# Patient Record
Sex: Female | Born: 1950 | Race: White | Hispanic: No
Health system: Western US, Academic
[De-identification: ages and names within clinical notes are randomized; demographics above are authoritative.]

## PROBLEM LIST (undated history)

## (undated) DIAGNOSIS — E119 Type 2 diabetes mellitus without complications: Secondary | ICD-10-CM

## (undated) DIAGNOSIS — F1020 Alcohol dependence, uncomplicated: Secondary | ICD-10-CM

## (undated) DIAGNOSIS — I10 Essential (primary) hypertension: Secondary | ICD-10-CM

## (undated) DIAGNOSIS — S329XXA Fracture of unspecified parts of lumbosacral spine and pelvis, initial encounter for closed fracture: Secondary | ICD-10-CM

## (undated) DIAGNOSIS — M1990 Unspecified osteoarthritis, unspecified site: Secondary | ICD-10-CM

## (undated) HISTORY — PX: APPENDECTOMY: SHX54

---

## 2006-02-18 LAB — CBC AND DIFFERENTIAL
HEMATOCRIT: 32 % — AB (ref 36–46)
HEMOGLOBIN: 11 g/dL — AB (ref 12.0–16.0)
Platelets: 281 10*3/uL (ref 150–399)
WBC: 7.6 10^3/mL

## 2006-02-18 LAB — BASIC METABOLIC PANEL
BUN: 4 mg/dL (ref 4–21)
Creatinine: 0.4 mg/dL — AB (ref 0.5–1.1)
Glucose: 120 mg/dL
Potassium: 4 mmol/L (ref 3.4–5.3)
SODIUM: 127 mmol/L — AB (ref 137–147)

## 2013-11-20 DIAGNOSIS — S329XXA Fracture of unspecified parts of lumbosacral spine and pelvis, initial encounter for closed fracture: Secondary | ICD-10-CM

## 2013-11-20 HISTORY — DX: Fracture of unspecified parts of lumbosacral spine and pelvis, initial encounter for closed fracture: S32.9XXA

## 2016-02-14 ENCOUNTER — Emergency Department (HOSPITAL_COMMUNITY): Payer: MEDICAID

## 2016-02-14 ENCOUNTER — Emergency Department (HOSPITAL_COMMUNITY): Payer: Self-pay

## 2016-02-14 ENCOUNTER — Encounter (HOSPITAL_COMMUNITY): Payer: Self-pay | Admitting: Emergency Medicine

## 2016-02-14 ENCOUNTER — Inpatient Hospital Stay (HOSPITAL_COMMUNITY)
Admission: EM | Admit: 2016-02-14 | Discharge: 2016-02-18 | DRG: 480 | Disposition: A | Discharge status: Skilled Nursing Facility | Payer: Self-pay | Attending: Internal Medicine | Admitting: Internal Medicine

## 2016-02-14 DIAGNOSIS — S72141A Displaced intertrochanteric fracture of right femur, initial encounter for closed fracture: Principal | ICD-10-CM | POA: Diagnosis present

## 2016-02-14 DIAGNOSIS — S72141D Displaced intertrochanteric fracture of right femur, subsequent encounter for closed fracture with routine healing: Secondary | ICD-10-CM

## 2016-02-14 DIAGNOSIS — Z833 Family history of diabetes mellitus: Secondary | ICD-10-CM

## 2016-02-14 DIAGNOSIS — F17210 Nicotine dependence, cigarettes, uncomplicated: Secondary | ICD-10-CM | POA: Diagnosis present

## 2016-02-14 DIAGNOSIS — D62 Acute posthemorrhagic anemia: Secondary | ICD-10-CM

## 2016-02-14 DIAGNOSIS — F1020 Alcohol dependence, uncomplicated: Secondary | ICD-10-CM | POA: Diagnosis present

## 2016-02-14 DIAGNOSIS — M160 Bilateral primary osteoarthritis of hip: Secondary | ICD-10-CM | POA: Diagnosis present

## 2016-02-14 DIAGNOSIS — Y92009 Unspecified place in unspecified non-institutional (private) residence as the place of occurrence of the external cause: Secondary | ICD-10-CM

## 2016-02-14 DIAGNOSIS — I10 Essential (primary) hypertension: Secondary | ICD-10-CM | POA: Diagnosis present

## 2016-02-14 DIAGNOSIS — Z681 Body mass index (BMI) 19 or less, adult: Secondary | ICD-10-CM

## 2016-02-14 DIAGNOSIS — E871 Hypo-osmolality and hyponatremia: Secondary | ICD-10-CM | POA: Diagnosis present

## 2016-02-14 DIAGNOSIS — S72143A Displaced intertrochanteric fracture of unspecified femur, initial encounter for closed fracture: Secondary | ICD-10-CM | POA: Diagnosis present

## 2016-02-14 DIAGNOSIS — Y92099 Unspecified place in other non-institutional residence as the place of occurrence of the external cause: Secondary | ICD-10-CM

## 2016-02-14 DIAGNOSIS — S72009A Fracture of unspecified part of neck of unspecified femur, initial encounter for closed fracture: Secondary | ICD-10-CM | POA: Diagnosis present

## 2016-02-14 DIAGNOSIS — W19XXXA Unspecified fall, initial encounter: Secondary | ICD-10-CM

## 2016-02-14 DIAGNOSIS — E43 Unspecified severe protein-calorie malnutrition: Secondary | ICD-10-CM | POA: Insufficient documentation

## 2016-02-14 DIAGNOSIS — Z7982 Long term (current) use of aspirin: Secondary | ICD-10-CM

## 2016-02-14 DIAGNOSIS — F1029 Alcohol dependence with unspecified alcohol-induced disorder: Secondary | ICD-10-CM

## 2016-02-14 DIAGNOSIS — Z419 Encounter for procedure for purposes other than remedying health state, unspecified: Secondary | ICD-10-CM

## 2016-02-14 DIAGNOSIS — E876 Hypokalemia: Secondary | ICD-10-CM | POA: Diagnosis present

## 2016-02-14 DIAGNOSIS — S72144A Nondisplaced intertrochanteric fracture of right femur, initial encounter for closed fracture: Secondary | ICD-10-CM

## 2016-02-14 DIAGNOSIS — M1990 Unspecified osteoarthritis, unspecified site: Secondary | ICD-10-CM | POA: Insufficient documentation

## 2016-02-14 DIAGNOSIS — E119 Type 2 diabetes mellitus without complications: Secondary | ICD-10-CM

## 2016-02-14 HISTORY — DX: Fracture of unspecified parts of lumbosacral spine and pelvis, initial encounter for closed fracture: S32.9XXA

## 2016-02-14 HISTORY — DX: Unspecified osteoarthritis, unspecified site: M19.90

## 2016-02-14 HISTORY — DX: Type 2 diabetes mellitus without complications: E11.9

## 2016-02-14 HISTORY — DX: Essential (primary) hypertension: I10

## 2016-02-14 HISTORY — DX: Alcohol dependence, uncomplicated: F10.20

## 2016-02-14 LAB — COMPREHENSIVE METABOLIC PANEL
ALBUMIN: 3.7 g/dL (ref 3.5–5.0)
ALK PHOS: 136 U/L — AB (ref 38–126)
ALT: 25 U/L (ref 14–54)
ANION GAP: 17 — AB (ref 5–15)
AST: 48 U/L — AB (ref 15–41)
BILIRUBIN TOTAL: 0.8 mg/dL (ref 0.3–1.2)
BUN: 6 mg/dL (ref 6–20)
CALCIUM: 10.1 mg/dL (ref 8.9–10.3)
CO2: 20 mmol/L — AB (ref 22–32)
Chloride: 90 mmol/L — ABNORMAL LOW (ref 101–111)
Creatinine, Ser: 0.6 mg/dL (ref 0.44–1.00)
GFR calc Af Amer: >60 mL/min (ref >60)
GFR calc non Af Amer: >60 mL/min (ref >60)
GLUCOSE: 71 mg/dL (ref 65–99)
Potassium: 3.9 mmol/L (ref 3.5–5.1)
SODIUM: 127 mmol/L — AB (ref 135–145)
TOTAL PROTEIN: 7.4 g/dL (ref 6.5–8.1)

## 2016-02-14 LAB — CBC WITH DIFFERENTIAL/PLATELET
BASOS ABS: 0 10*3/uL (ref 0.0–0.1)
Basophils Relative: 0 %
Eosinophils Absolute: 0.1 10*3/uL (ref 0.0–0.7)
Eosinophils Relative: 1 %
HEMATOCRIT: 36.5 % (ref 36.0–46.0)
HEMOGLOBIN: 13.5 g/dL (ref 12.0–15.0)
LYMPHS ABS: 0.7 10*3/uL (ref 0.7–4.0)
LYMPHS PCT: 11 %
MCH: 35 pg — AB (ref 26.0–34.0)
MCHC: 37 g/dL — ABNORMAL HIGH (ref 30.0–36.0)
MCV: 94.6 fL (ref 78.0–100.0)
Monocytes Absolute: 0.6 10*3/uL (ref 0.1–1.0)
Monocytes Relative: 9 %
NEUTROS ABS: 5.1 10*3/uL (ref 1.7–7.7)
NEUTROS PCT: 78 %
PLATELETS: 365 10*3/uL (ref 150–400)
RBC: 3.86 MIL/uL — AB (ref 3.87–5.11)
RDW: 13.5 % (ref 11.5–15.5)
WBC: 6.5 10*3/uL (ref 4.0–10.5)

## 2016-02-14 LAB — SURGICAL PCR SCREEN
MRSA, PCR: NEGATIVE
STAPHYLOCOCCUS AUREUS: NEGATIVE

## 2016-02-14 LAB — PROTIME-INR
INR: 0.97 (ref 0.00–1.49)
Prothrombin Time: 12.7 seconds (ref 11.6–15.2)

## 2016-02-14 LAB — GLUCOSE, CAPILLARY
GLUCOSE-CAPILLARY: 66 mg/dL (ref 65–99)
Glucose-Capillary: 114 mg/dL — ABNORMAL HIGH (ref 65–99)

## 2016-02-14 LAB — GAMMA GT: GGT: 204 U/L — ABNORMAL HIGH (ref 7–50)

## 2016-02-14 LAB — ABO/RH: ABO/RH(D): O POS

## 2016-02-14 MED ORDER — INSULIN ASPART 100 UNIT/ML ~~LOC~~ SOLN
0.0000 [IU] | Freq: Three times a day (TID) | SUBCUTANEOUS | Status: DC
  Administered 2016-02-15: 1 [IU] via SUBCUTANEOUS
  Administered 2016-02-16: 2 [IU] via SUBCUTANEOUS
  Administered 2016-02-16: 1 [IU] via SUBCUTANEOUS

## 2016-02-14 MED ORDER — VITAMIN B-1 100 MG PO TABS
100.0000 mg | ORAL_TABLET | Freq: Every day | ORAL | Status: DC
  Administered 2016-02-14 – 2016-02-18 (×5): 100 mg via ORAL
  Filled 2016-02-14 (×5): qty 1

## 2016-02-14 MED ORDER — TEMAZEPAM 15 MG PO CAPS: 15.0000 mg | ORAL_CAPSULE | Freq: Every evening | ORAL | Status: DC | PRN

## 2016-02-14 MED ORDER — HYDROCODONE-ACETAMINOPHEN 5-325 MG PO TABS
1.0000 | ORAL_TABLET | ORAL | Status: DC | PRN
  Filled 2016-02-14: qty 2

## 2016-02-14 MED ORDER — PRENATAL MULTIVITAMIN CH
1.0000 | ORAL_TABLET | Freq: Every day | ORAL | Status: DC
  Administered 2016-02-14 – 2016-02-18 (×4): 1 via ORAL
  Filled 2016-02-14 (×5): qty 1

## 2016-02-14 MED ORDER — ONDANSETRON HCL 4 MG/2ML IJ SOLN
4.0000 mg | Freq: Once | INTRAMUSCULAR | Status: AC
  Administered 2016-02-14: 4 mg via INTRAVENOUS
  Filled 2016-02-14: qty 2

## 2016-02-14 MED ORDER — SODIUM CHLORIDE 0.9 % IV BOLUS (SEPSIS)
500.0000 mL | Freq: Once | INTRAVENOUS | Status: AC
  Administered 2016-02-14: 500 mL via INTRAVENOUS

## 2016-02-14 MED ORDER — SENNA 8.6 MG PO TABS
1.0000 | ORAL_TABLET | Freq: Two times a day (BID) | ORAL | Status: DC
  Administered 2016-02-14 – 2016-02-18 (×7): 8.6 mg via ORAL

## 2016-02-14 MED ORDER — FOLIC ACID 1 MG PO TABS
1.0000 mg | ORAL_TABLET | Freq: Every day | ORAL | Status: DC
  Administered 2016-02-14 – 2016-02-18 (×5): 1 mg via ORAL
  Filled 2016-02-14 (×5): qty 1

## 2016-02-14 MED ORDER — LORAZEPAM 2 MG/ML IJ SOLN
1.0000 mg | Freq: Four times a day (QID) | INTRAMUSCULAR | Status: DC | PRN
  Administered 2016-02-15: 1 mg via INTRAVENOUS
  Filled 2016-02-14: qty 1

## 2016-02-14 MED ORDER — METHOCARBAMOL 1000 MG/10ML IJ SOLN
500.0000 mg | Freq: Four times a day (QID) | INTRAMUSCULAR | Status: DC | PRN
  Administered 2016-02-14 – 2016-02-15 (×3): 500 mg via INTRAVENOUS
  Filled 2016-02-14 (×6): qty 5

## 2016-02-14 MED ORDER — CALCIUM CARBONATE-VITAMIN D 500-200 MG-UNIT PO TABS
1.0000 | ORAL_TABLET | Freq: Every day | ORAL | Status: DC
  Administered 2016-02-14 – 2016-02-18 (×5): 1 via ORAL
  Filled 2016-02-14 (×5): qty 1

## 2016-02-14 MED ORDER — HYDROMORPHONE HCL 1 MG/ML IJ SOLN: 1.0000 mg | INTRAMUSCULAR | Status: DC | PRN

## 2016-02-14 MED ORDER — SODIUM CHLORIDE 0.9 % IV SOLN
INTRAVENOUS | Status: DC
  Administered 2016-02-14: 75 mL/h via INTRAVENOUS
  Administered 2016-02-14 – 2016-02-15 (×3): via INTRAVENOUS

## 2016-02-14 MED ORDER — LORAZEPAM 2 MG/ML IJ SOLN
0.5000 mg | Freq: Once | INTRAMUSCULAR | Status: AC
Start: 2016-02-14 — End: 2016-02-14
  Administered 2016-02-14: 0.5 mg via INTRAVENOUS
  Filled 2016-02-14: qty 1

## 2016-02-14 MED ORDER — ONDANSETRON HCL 4 MG/2ML IJ SOLN
4.0000 mg | Freq: Three times a day (TID) | INTRAMUSCULAR | Status: DC | PRN
  Administered 2016-02-14: 4 mg via INTRAVENOUS
  Filled 2016-02-14: qty 2

## 2016-02-14 MED ORDER — THIAMINE HCL 100 MG/ML IJ SOLN
100.0000 mg | Freq: Every day | INTRAMUSCULAR | Status: DC
  Filled 2016-02-14 (×5): qty 1

## 2016-02-14 MED ORDER — OXYCODONE HCL 5 MG PO TABS
5.0000 mg | ORAL_TABLET | ORAL | Status: DC | PRN
  Administered 2016-02-14 – 2016-02-16 (×8): 10 mg via ORAL
  Filled 2016-02-14 (×8): qty 2

## 2016-02-14 MED ORDER — MORPHINE SULFATE (PF) 4 MG/ML IV SOLN
4.0000 mg | Freq: Once | INTRAVENOUS | Status: AC
  Administered 2016-02-14: 4 mg via INTRAVENOUS
  Filled 2016-02-14: qty 1

## 2016-02-14 MED ORDER — INSULIN ASPART 100 UNIT/ML ~~LOC~~ SOLN: 0.0000 [IU] | Freq: Every day | SUBCUTANEOUS | Status: DC

## 2016-02-14 MED ORDER — POLYETHYLENE GLYCOL 3350 17 G PO PACK: 17.0000 g | PACK | Freq: Every day | ORAL | Status: DC | PRN

## 2016-02-14 MED ORDER — HYDROMORPHONE HCL 1 MG/ML IJ SOLN
1.0000 mg | INTRAMUSCULAR | Status: DC | PRN
Start: 2016-02-14 — End: 2016-02-16
  Administered 2016-02-14: 1 mg via INTRAVENOUS
  Filled 2016-02-14: qty 1

## 2016-02-14 MED ORDER — ADULT MULTIVITAMIN W/MINERALS CH
1.0000 | ORAL_TABLET | Freq: Every day | ORAL | Status: DC
Start: 2016-02-14 — End: 2016-02-14
  Filled 2016-02-14: qty 1

## 2016-02-14 MED ORDER — LORAZEPAM 1 MG PO TABS
1.0000 mg | ORAL_TABLET | Freq: Four times a day (QID) | ORAL | Status: DC | PRN
  Administered 2016-02-14 – 2016-02-15 (×2): 1 mg via ORAL
  Filled 2016-02-14 (×2): qty 1

## 2016-02-14 MED ORDER — ENOXAPARIN SODIUM 40 MG/0.4ML ~~LOC~~ SOLN
40.0000 mg | SUBCUTANEOUS | Status: DC
  Administered 2016-02-14: 40 mg via SUBCUTANEOUS
  Filled 2016-02-14 (×2): qty 0.4

## 2016-02-14 NOTE — H&P (Addendum)
History and Physical:    Alexis Wang   MWU:[id number] DOB: 1951-10-12 DOA: 02/14/2016  Referring MD/provider: Dr. Blinda Leatherwood PCP: No primary care provider on file.   Chief Complaint: Right hip pain  History of Present Illness:   Alexis Wang is an 65 y.o. female with a PMH of diet controlled diabetes, alcohol dependence, tobacco abuse and hypertension who is visiting her son from out of town, and presented to the ED this morning after a mechanical fall. Patient ambulates with a walker and reports that she got her feet tangled up and fell, landing on her right side. She had severe pain in the right hip and was unable to ambulate or bear weight after the fall. Movement exacerbates the pain. Morphine, given in the ED, has not appreciably helped her pain. Denies any loss of consciousness. Patient admits to drinking 2 beers in the morning, and 2-3 mixed drinks in the evening. She denies ever going through withdrawal symptoms. Of note, she minimizes her tobacco abuse tell me she "quit" for several years. She is accompanied by her son who reports that she has always been a heavy drinker, but that her drinking at worse after she and her husband divorced. Patient has already been seen by the orthopedic surgeon with plans to proceed with operative repair 02/15/16. Patient is asking if she can be discharged from the hospital and return tomorrow for this surgery. Of note, the patient fractured her pelvis 2 years ago after a fall, and her son says she signed herself out of the hospital because she wanted to smoke and drink. Patient denies any falls since that time other than today's fall.  ROS:   Review of Systems  Constitutional: Negative for fever and chills.  Respiratory: Negative.  Negative for shortness of breath.   Cardiovascular: Negative for chest pain.  Gastrointestinal: Negative for heartburn, vomiting, diarrhea and blood in stool.  Genitourinary: Negative for dysuria.  Musculoskeletal: Positive  for joint pain and falls.  Skin: Negative.   Neurological: Negative for dizziness and headaches.  Psychiatric/Behavioral: Negative.      Past Medical History:   Past Medical History  Diagnosis Date  . Diabetes mellitus without complication (HCC)   . HTN (hypertension)   . OA (osteoarthritis)   . Pelvic fracture (HCC) 2015  . Alcohol dependency [facility])     Past Surgical History:   Past Surgical History  Procedure Laterality Date  . Appendectomy      Social History:   Social History   Social History  . Marital Status: Divorced    Spouse Name: N/A  . Number of Children: 2  . Years of Education: N/A   Occupational History  . Retired IT trainer    Social History Main Topics  . Smoking status: Current Every Day Smoker -- 0.50 packs/day for 25 years    Types: Cigarettes  . Smokeless tobacco: Not on file  . Alcohol Use: 0.0 oz/week    0 Standard drinks or equivalent per week     Comment: 2 beers a day, 2-3 mixed drinks at night.  . Drug Use: No  . Sexual Activity: Not on file   Other Topics Concern  . Not on file   Social History Narrative   Visiting son from [place].  Lives alone.  Divorced with 2 children who live out of state, son in [place], daughter in [place].    Family history:   Family History  Problem Relation Age of Onset  . Diabetes Son   .  Cancer Mother     Died of Colon Cancer    Allergies   Penicillins  Current Medications:   Prior to Admission medications   Medication Sig Start Date End Date Taking? Authorizing Provider  aspirin 325 MG tablet Take 325 mg by mouth daily.   Yes Historical Provider, MD  calcium-vitamin D (OSCAL WITH D) 500-200 MG-UNIT tablet Take 1 tablet by mouth daily.   Yes Historical Provider, MD  ibuprofen (ADVIL,MOTRIN) 200 MG tablet Take 600 mg by mouth every 8 (eight) hours as needed (for pain.).   Yes Historical Provider, MD  oxyCODONE (OXY IR/ROXICODONE) 5 MG immediate release tablet Take 5 mg by mouth 2 (two) times daily as  needed. For pain. 01/20/16  Yes Historical Provider, MD  Prenatal Vit-Fe Fumarate-FA (PRENATAL MULTIVITAMIN) TABS tablet Take 1 tablet by mouth daily at 12 noon.   Yes Historical Provider, MD    Physical Exam:   Filed Vitals:   02/14/16 0416 02/14/16 0431 02/14/16 0706 02/14/16 0739  BP: 148/82 160/88 145/86 139/79  Pulse: 92 96 98   Temp: 98.2 F (36.8 C) 98.2 F (36.8 C)    TempSrc: Oral Oral    Resp: SpO2: 98% 98% 99%      Physical Exam: Blood pressure 139/79, pulse 98, temperature 98.2 F (36.8 C), temperature source Oral, resp. rate 16, SpO2 99 %. Gen: No acute distress.Appears older than stated age. Head: Normocephalic, atraumatic. Eyes: PERRL, EOMI, sclerae nonicteric. Mouth: Oropharynx clear with fair dentition. Neck: Supple, no thyromegaly, no lymphadenopathy, no jugular venous distention. Chest: Lungs are clear to auscultation bilaterally with good air movement. CV: Heart sounds are regular. No murmurs, rubs, or gallops. Abdomen: Soft, nontender, nondistended with normal active bowel sounds. Extremities: Extremities are without clubbing, edema, or cyanosis. Pedal pulses 2+. Skin: Warm and dry. Scattered ecchymosis, especially on hands. Neuro: Alert and oriented times 3; grossly nonfocal.  Psych: Mood and affect anxious.   Data Review:    Labs: Basic Metabolic Panel:  Recent Labs Lab 02/14/16 0515  NA 127*  K 3.9  CL 90*  CO2 20*  GLUCOSE 71  BUN 6  CREATININE 0.60  CALCIUM 10.1   Liver Function Tests:  Recent Labs Lab 02/14/16 0515  AST 48*  ALT 25  ALKPHOS 136*  BILITOT 0.8  PROT 7.4  ALBUMIN 3.7   CBC:  Recent Labs Lab 02/14/16 0515  WBC 6.5  NEUTROABS 5.1  HGB 13.5  HCT 36.5  MCV 94.6  PLT 365    Radiographic Studies: Dg Chest 1 View  02/14/2016  CLINICAL DATA:  Larey Seat in living room tonight.  RIGHT hip pain. EXAM: CHEST 1 VIEW COMPARISON:  None. FINDINGS: The heart size and mediastinal contours are within normal  limits. Mild calcific atherosclerosis of the descending aorta. Both lungs are clear. The visualized skeletal structures are unremarkable. IMPRESSION: No acute cardiopulmonary process. Electronically Signed   By: Awilda Metro M.D.   On: 02/14/2016 06:39   Dg Hip Unilat With Pelvis 2-3 Views Right  02/14/2016  CLINICAL DATA:  Larey Seat in living room tonight. RIGHT hip pain. EXAM: DG HIP (WITH OR WITHOUT PELVIS) 2-3V RIGHT COMPARISON:  None. FINDINGS: Acute RIGHT femur intertrochanteric fracture with mild impaction, in general alignment. Age indeterminate LEFT superior pubic ramus nondisplaced fracture. No dislocation. Osteopenia without destructive bony lesions. Severe degenerative change of the LEFT hip with mild femoral head collapse. Soft tissue planes are nonsuspicious. IMPRESSION: Acute mildly impacted RIGHT femur intertrochanteric fracture. No dislocation.  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 Age indeterminate LEFT superior pubic ramus nondisplaced fracture. Electronically Signed   By: Awilda Metroourtnay  Bloomer M.D.   On: 02/14/2016 06:41    EKG: Ordered, has not been done yet.   Assessment/Plan:   Principal Problem:   Intertrochanteric fracture of femur (HCC)Secondary to mechanical fall - ASA pre-op protocal ordered per hip fracture order set. - Activity: Bedrest with HOB elevated 30 degrees. - Hip films show: Acute mildly impacted right femur intertrochanteric fracture without dislocation. - Orthopedic surgery consulted. - CXR shows: No acute findings. - EKG pending. - Pre-op clearance performed. Proceed with surgery. - SW consult for SNF placement.  - PT evaluation postoperatively. - Percocet /Dilaudid ordered for pain control. - Continuous pulse oximetry x 24 hours then Q 4. - Robaxin PRN for muscle relaxation. - Bowel regimen initiated.  Active Problems:   Diet controlled diabetes/diabetes mellitus without complication - Check hemoglobin  A1c and place on insulin sensitive SSI.    Alcohol dependency (HCC) - Suspect she drinks more than she admits to, and is at high risk for alcohol withdrawal. - Initiate detox protocol with Ativan. - Supplement thiamine and folic acid. - Check GGT.    Hyponatremia - Suspect this is secondary to beer drinkers potomania. - Hydrate and monitor.    Bilateral hip osteoarthritis - Check vitamin D levels.    DVT prophylaxis - Lovenox ordered to be given postoperatively.  Code Status / Family Communication / Disposition Plan:   Code Status: Full. Family Communication: Zada Girtrevor Alumbaugh (son), [phone] -- updated at bedside.. Disposition Plan: Home versus SNF when stable.  Time spent: One hour.  RAMA,CHRISTINA [facility] Pager [phone] Cell: [phone]   If 7PM-7AM, please contact night-coverage [email or web address] Password [email or web address] 02/14/2016, 7:57 AM

## 2016-02-14 NOTE — ED Provider Notes (Addendum)
CSN: [id number]     Arrival date & time 02/14/16  0355 History   First MD Initiated Contact with Patient 02/14/16 [phone]     Chief Complaint  Patient presents with  . Hip Pain     (Consider location/radiation/quality/duration/timing/severity/associated sxs/prior Treatment) HPI Comments: Patient presents to the emergency department for evaluation of right hip pain after a fall. Patient reports that she fell approximately 45 hours ago. She normally walks with a walker. Patient reports that she got her feet tangled up and fell, landing on her right side. She has had severe pain on her right hip ever since. She cannot bear any weight on it because of the pain. She did not hit her head or lose consciousness. No neck or back pain.  Patient is a 65 y.o. female presenting with hip pain.  Hip Pain    Past Medical History  Diagnosis Date  . Diabetes mellitus without complication [facility](HCC)    Past Surgical History  Procedure Laterality Date  . Appendectomy     No family history on file. Social History  Substance Use Topics  . Smoking status: Current Every Day Smoker  . Smokeless tobacco: None  . Alcohol Use: Yes   OB History    No data available     Review of Systems  Musculoskeletal: Positive for arthralgias.  All other systems reviewed and are negative.     Allergies  Penicillins  Home Medications   Prior to Admission medications   Medication Sig Start Date End Date Taking? Authorizing Provider  aspirin 325 MG tablet Take 325 mg by mouth daily.   Yes Historical Provider, MD  calcium-vitamin D (OSCAL WITH D) 500-200 MG-UNIT tablet Take 1 tablet by mouth daily.   Yes Historical Provider, MD  ibuprofen (ADVIL,MOTRIN) 200 MG tablet Take 600 mg by mouth every 8 (eight) hours as needed (for pain.).   Yes Historical Provider, MD  oxyCODONE (OXY IR/ROXICODONE) 5 MG immediate release tablet Take 5 mg by mouth 2 (two) times daily as needed. For pain. 01/20/16  Yes Historical Provider, MD   Prenatal Vit-Fe Fumarate-FA (PRENATAL MULTIVITAMIN) TABS tablet Take 1 tablet by mouth daily at 12 noon.   Yes Historical Provider, MD   BP 160/88 mmHg  Pulse 96  Temp(Src) 98.2 F (36.8 C) (Oral)  Resp 16  SpO2 98% Physical Exam  Constitutional: She is oriented to person, place, and time. She appears well-developed and well-nourished. No distress.  HENT:  Head: Normocephalic and atraumatic.  Right Ear: Hearing normal.  Left Ear: Hearing normal.  Nose: Nose normal.  Mouth/Throat: Oropharynx is clear and moist and mucous membranes are normal.  Eyes: Conjunctivae and EOM are normal. Pupils are equal, round, and reactive to light.  Neck: Normal range of motion. Neck supple.  Cardiovascular: Regular rhythm, S1 normal and S2 normal.  Exam reveals no gallop and no friction rub.   No murmur heard. Pulmonary/Chest: Effort normal and breath sounds normal. No respiratory distress. She exhibits no tenderness.  Abdominal: Soft. Normal appearance and bowel sounds are normal. There is no hepatosplenomegaly. There is no tenderness. There is no rebound, no guarding, no tenderness at McBurney's point and negative Murphy's sign. No hernia.  Musculoskeletal:       Right hip: She exhibits decreased range of motion and tenderness.  Neurological: She is alert and oriented to person, place, and time. She has normal strength. No cranial nerve deficit or sensory deficit. Coordination normal. GCS eye subscore is 4. GCS verbal subscore is 5. GCS  motor subscore is 6.  Skin: Skin is warm, dry and intact. No rash noted. No cyanosis.  Psychiatric: She has a normal mood and affect. Her speech is normal and behavior is normal. Thought content normal.  Nursing note and vitals reviewed.   ED Course  Procedures (including critical care time) Labs Review Labs Reviewed  CBC WITH DIFFERENTIAL/PLATELET - Abnormal; Notable for the following:    RBC 3.86 (*)    MCH 35.0 (*)    MCHC 37.0 (*)    All other components  within normal limits  COMPREHENSIVE METABOLIC PANEL - Abnormal; Notable for the following:    Sodium 127 (*)    Chloride 90 (*)    CO2 20 (*)    AST 48 (*)    Alkaline Phosphatase 136 (*)    Anion gap 17 (*)    All other components within normal limits  PROTIME-INR    Imaging Review Dg Chest 1 View  02/14/2016  CLINICAL DATA:  Larey Seat in living room tonight.  RIGHT hip pain. EXAM: CHEST 1 VIEW COMPARISON:  None. FINDINGS: The heart size and mediastinal contours are within normal limits. Mild calcific atherosclerosis of the descending aorta. Both lungs are clear. The visualized skeletal structures are unremarkable. IMPRESSION: No acute cardiopulmonary process. Electronically Signed   By: Awilda Metro M.D.   On: 02/14/2016 06:39   Dg Hip Unilat With Pelvis 2-3 Views Right  02/14/2016  CLINICAL DATA:  Larey Seat in living room tonight. RIGHT hip pain. EXAM: DG HIP (WITH OR WITHOUT PELVIS) 2-3V RIGHT COMPARISON:  None. FINDINGS: Acute RIGHT femur intertrochanteric fracture with mild impaction, in general alignment. Age indeterminate LEFT superior pubic ramus nondisplaced fracture. No dislocation. Osteopenia without destructive bony lesions. Severe degenerative change of the LEFT hip with mild femoral head collapse. Soft tissue planes are nonsuspicious. IMPRESSION: Acute mildly impacted RIGHT femur intertrochanteric fracture. No dislocation. 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 Age indeterminate LEFT superior pubic ramus nondisplaced fracture. Electronically Signed   By: Awilda Metro M.D.   On: 02/14/2016 06:41   I have personally reviewed and evaluated these images and lab results as part of my medical decision-making.   EKG Interpretation None      MDM   Final diagnoses:  None  Intertrochanteric hip fracture  Patient presents to the ER for evaluation of severe right hip pain after a fall. Patient had a ground-level fall when she tangled up  in her walker. She is complaining of pain in her hip alone. There was no head injury or concern for intracranial injury. She does not have any neck or back pain. Examination reveals severe pain with any minimal range of motion of the right hip. No deformity is noted because she is holding the hip and knee flexed position, resisting movement. X-ray, however, does show intertrochanteric hip fracture. Will admit to the hospitalist for surgical repair by orthopedic surgery. Discussed with Dr. Ophelia Charter, on call for orthopedic surgery. Anticipate surgery tomorrow.    Gilda Crease, MD 02/14/16 1610  Gilda Crease, MD 02/14/16 [phone]

## 2016-02-14 NOTE — Consult Note (Signed)
Patient ID: Alexis Wang MRN: [id number] DOB/AGE: 01/15/1951 65 y.o.  Admit date: 02/14/2016  Admission Diagnoses:  Principal Problem:   Intertrochanteric fracture of femur (HCC) Active Problems:   Diabetes mellitus without complication (HCC)   Alcohol dependency (HCC)   Hyponatremia   Fall at home   Diet-controlled diabetes mellitus (HCC)   HPI: Patient being seen at the request of the hospitalist for right hip IT fracture.  Ambulates with a walker for chronic left hip issues and got her feet tangled up very early this AM and fell onto her right hip.  Unable to weightbear.  Hx of etoh abuse.  Lives in [place] and is here visiting son for a wedding.  Was expected to fly back home tomorrow.     Past Medical History: Past Medical History  Diagnosis Date  . Diabetes mellitus without complication (HCC)   . HTN (hypertension)   . OA (osteoarthritis)   . Pelvic fracture (HCC) 2015  . Alcohol dependency (HCC)     Surgical History: Past Surgical History  Procedure Laterality Date  . Appendectomy      Family History: Family History  Problem Relation Age of Onset  . Diabetes Son   . Cancer Mother     Died of Colon Cancer    Social History: Social History   Social History  . Marital Status: Divorced    Spouse Name: N/A  . Number of Children: 2  . Years of Education: N/A   Occupational History  . Retired CPA    Social History Main Topics  . Smoking status: Current Every Day Smoker -- 0.50 packs/day for 25 years    Types: Cigarettes  . Smokeless tobacco: Not on file  . Alcohol Use: 0.0 oz/week    0 Standard drinks or equivalent per week     Comment: 2 beers a day, 2-3 mixed drinks at night.  . Drug Use: No  . Sexual Activity: Not on file   Other Topics Concern  . Not on file   Social History Narrative   Visiting son from [place].  Lives alone.  Divorced with 2 children who live out of state, son in [place], daughter in [place].     Allergies: Penicillins  Medications: I have reviewed the patient's current medications.  Vital Signs: Patient Vitals for the past 24 hrs:  BP Temp Temp src Pulse Resp SpO2 Height Weight  02/14/16 0949 (!) 160/82 mmHg - - 88 - - - -  02/14/16 0844 (!) 199/107 mmHg 98.1 F (36.7 C) Oral (!) 116 20 100 % 5' 5" (1.651 m) 47.628 kg (105 lb)  02/14/16 0739 139/79 mmHg - - - - - - -  02/14/16 0706 145/86 mmHg - - 98 16 99 % - -  02/14/16 0431 160/88 mmHg 98.2 F (36.8 C) Oral 96 16 98 % - -  02/14/16 0416 148/82 mmHg 98.2 F (36.8 C) Oral 92 20 98 % - -    Radiology: Dg Chest 1 View  02/14/2016  CLINICAL DATA:  Fell in living room tonight.  RIGHT hip pain. EXAM: CHEST 1 VIEW COMPARISON:  None. FINDINGS: The heart size and mediastinal contours are within normal limits. Mild calcific atherosclerosis of the descending aorta. Both lungs are clear. The visualized skeletal structures are unremarkable. IMPRESSION: No acute cardiopulmonary process. Electronically Signed   By: Courtnay  Bloomer M.D.   On: 02/14/2016 06:39   Dg Hip Unilat With Pelvis 2-3 Views Right  02/14/2016  CLINICAL DATA:  Fell in living room   tonight. RIGHT hip pain. EXAM: DG HIP (WITH OR WITHOUT PELVIS) 2-3V RIGHT COMPARISON:  None. FINDINGS: Acute RIGHT femur intertrochanteric fracture with mild impaction, in general alignment. Age indeterminate LEFT superior pubic ramus nondisplaced fracture. No dislocation. Osteopenia without destructive bony lesions. Severe degenerative change of the LEFT hip with mild femoral head collapse. Soft tissue planes are nonsuspicious. IMPRESSION: Acute mildly impacted RIGHT femur intertrochanteric fracture. No dislocation. 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 Age indeterminate LEFT superior pubic ramus nondisplaced fracture. Electronically Signed   By: Courtnay  Bloomer M.D.   On: 02/14/2016 06:41    Labs:  Recent Labs  02/14/16 0515  WBC 6.5   RBC 3.86*  HCT 36.5  PLT 365    Recent Labs  02/14/16 0515  NA 127*  K 3.9  CL 90*  CO2 20*  BUN 6  CREATININE 0.60  GLUCOSE 71  CALCIUM 10.1    Recent Labs  02/14/16 0515  INR 0.97    Review of Systems: Review of Systems  Constitutional: Negative.   HENT: Negative.   Respiratory: Negative.   Cardiovascular: Negative.   Gastrointestinal: Negative.   Genitourinary: Negative.   Musculoskeletal: Positive for joint pain.  Psychiatric/Behavioral: Negative.     Physical Exam: Alert and oriented.  Head normocephalic.  Cervical spine unremarkable.  abd flat, nondistended. No respiratory distress.  bilat calves nontender.  Nvi.  Skin warm and dry.  Pain right hip with attempted movement.   Assessment and Plan: Right hip IT fracture End stage djd left Hip.  Question AVN. Hx of ETOH abuse  Discussed with patient that best treatment option for right hip is ORIF procedure.  This was discussed with patient.  Family members not present.  Dr Alexis Wang will plan for OR tomorrow afternoon.  Advised patient that she will very likely need snf placement for rehab due to chronic issue with left hip.  Lives in [place] and has been advised by orthopedic surgeon there that she needs THR. All questions answered. NPO after midnight.   Alexis M. Owens PA-C Alexis Adamec MD [facility]  Risks of alcohol withdrawal while in hospital . 2 beers in AM and 4 plus vodka drinks in afternoon/ evening. Had left hip Fx a few years ago, in hospital with no DT/s.  Discussed procedure, risks. Surgery Tuesday at 5 PM.  NPO after clear liquid breakfast.  

## 2016-02-14 NOTE — ED Notes (Signed)
Pt c/o R hip pain x 4-5 hours after fall while walking with walker.

## 2016-02-14 NOTE — ED Notes (Signed)
MD at bedside. 

## 2016-02-15 ENCOUNTER — Encounter (HOSPITAL_COMMUNITY)
Admission: EM | Disposition: A | Discharge status: Skilled Nursing Facility | Payer: Self-pay | Source: Home / Self Care | Attending: Internal Medicine

## 2016-02-15 ENCOUNTER — Encounter (HOSPITAL_COMMUNITY): Payer: Self-pay | Admitting: Anesthesiology

## 2016-02-15 ENCOUNTER — Inpatient Hospital Stay (HOSPITAL_COMMUNITY): Payer: Self-pay | Admitting: Anesthesiology

## 2016-02-15 ENCOUNTER — Inpatient Hospital Stay (HOSPITAL_COMMUNITY): Payer: MEDICAID | Admitting: Anesthesiology

## 2016-02-15 ENCOUNTER — Inpatient Hospital Stay (HOSPITAL_COMMUNITY): Payer: MEDICAID

## 2016-02-15 DIAGNOSIS — S72141D Displaced intertrochanteric fracture of right femur, subsequent encounter for closed fracture with routine healing: Secondary | ICD-10-CM

## 2016-02-15 DIAGNOSIS — E43 Unspecified severe protein-calorie malnutrition: Secondary | ICD-10-CM | POA: Insufficient documentation

## 2016-02-15 HISTORY — PX: FEMUR IM NAIL: SHX1597

## 2016-02-15 LAB — GLUCOSE, CAPILLARY
GLUCOSE-CAPILLARY: 124 mg/dL — AB (ref 65–99)
GLUCOSE-CAPILLARY: 134 mg/dL — AB (ref 65–99)
GLUCOSE-CAPILLARY: 186 mg/dL — AB (ref 65–99)
Glucose-Capillary: 94 mg/dL (ref 65–99)

## 2016-02-15 LAB — BASIC METABOLIC PANEL
ANION GAP: 12 (ref 5–15)
BUN: 6 mg/dL (ref 6–20)
CHLORIDE: 95 mmol/L — AB (ref 101–111)
CO2: 21 mmol/L — ABNORMAL LOW (ref 22–32)
Calcium: 9.4 mg/dL (ref 8.9–10.3)
Creatinine, Ser: 0.5 mg/dL (ref 0.44–1.00)
GFR calc non Af Amer: >60 mL/min (ref >60)
Glucose, Bld: 120 mg/dL — ABNORMAL HIGH (ref 65–99)
POTASSIUM: 4 mmol/L (ref 3.5–5.1)
SODIUM: 128 mmol/L — AB (ref 135–145)

## 2016-02-15 LAB — CBC
HCT: 28.4 % — ABNORMAL LOW (ref 36.0–46.0)
Hemoglobin: 10.1 g/dL — ABNORMAL LOW (ref 12.0–15.0)
MCH: 34.6 pg — ABNORMAL HIGH (ref 26.0–34.0)
MCHC: 35.6 g/dL (ref 30.0–36.0)
MCV: 97.3 fL (ref 78.0–100.0)
PLATELETS: 188 10*3/uL (ref 150–400)
RBC: 2.92 MIL/uL — AB (ref 3.87–5.11)
RDW: 13.6 % (ref 11.5–15.5)
WBC: 6.9 10*3/uL (ref 4.0–10.5)

## 2016-02-15 LAB — CREATININE, SERUM: CREATININE: 0.54 mg/dL (ref 0.44–1.00)

## 2016-02-15 LAB — HEMOGLOBIN A1C
Hgb A1c MFr Bld: 4.8 % (ref 4.8–5.6)
Mean Plasma Glucose: 91 mg/dL

## 2016-02-15 LAB — VITAMIN D 25 HYDROXY (VIT D DEFICIENCY, FRACTURES): Vit D, 25-Hydroxy: 41.6 ng/mL (ref 30.0–100.0)

## 2016-02-15 SURGERY — INSERTION, INTRAMEDULLARY ROD, FEMUR
Anesthesia: General | Site: Hip | Laterality: Right

## 2016-02-15 MED ORDER — BUPIVACAINE HCL (PF) 0.25 % IJ SOLN
INTRAMUSCULAR | Status: AC
Start: 2016-02-15 — End: 2016-02-15
  Filled 2016-02-15: qty 30

## 2016-02-15 MED ORDER — PROPOFOL 10 MG/ML IV BOLUS
INTRAVENOUS | Status: AC
  Filled 2016-02-15: qty 20

## 2016-02-15 MED ORDER — ACETAMINOPHEN 650 MG RE SUPP: 650.0000 mg | Freq: Four times a day (QID) | RECTAL | Status: DC | PRN

## 2016-02-15 MED ORDER — MIDAZOLAM HCL 2 MG/2ML IJ SOLN
INTRAMUSCULAR | Status: DC | PRN
  Administered 2016-02-15: 0.5 mg via INTRAVENOUS

## 2016-02-15 MED ORDER — SUCCINYLCHOLINE CHLORIDE 20 MG/ML IJ SOLN
INTRAMUSCULAR | Status: DC | PRN
  Administered 2016-02-15: 60 mg via INTRAVENOUS

## 2016-02-15 MED ORDER — METOCLOPRAMIDE HCL 5 MG/ML IJ SOLN: 5.0000 mg | Freq: Three times a day (TID) | INTRAMUSCULAR | Status: DC | PRN

## 2016-02-15 MED ORDER — LIDOCAINE HCL (CARDIAC) 20 MG/ML IV SOLN
INTRAVENOUS | Status: AC
  Filled 2016-02-15: qty 5

## 2016-02-15 MED ORDER — SUGAMMADEX SODIUM 200 MG/2ML IV SOLN
INTRAVENOUS | Status: DC | PRN
  Administered 2016-02-15: 200 mg via INTRAVENOUS

## 2016-02-15 MED ORDER — ONDANSETRON HCL 4 MG/2ML IJ SOLN
INTRAMUSCULAR | Status: DC | PRN
  Administered 2016-02-15: 4 mg via INTRAVENOUS

## 2016-02-15 MED ORDER — DEXAMETHASONE SODIUM PHOSPHATE 10 MG/ML IJ SOLN
INTRAMUSCULAR | Status: DC | PRN
  Administered 2016-02-15: 5 mg via INTRAVENOUS

## 2016-02-15 MED ORDER — SUFENTANIL CITRATE 50 MCG/ML IV SOLN
INTRAVENOUS | Status: DC | PRN
  Administered 2016-02-15 (×2): 2.5 ug via INTRAVENOUS
  Administered 2016-02-15: 10 ug via INTRAVENOUS
  Administered 2016-02-15 (×2): 2.5 ug via INTRAVENOUS
  Administered 2016-02-15: 10 ug via INTRAVENOUS

## 2016-02-15 MED ORDER — ACETAMINOPHEN 325 MG PO TABS
650.0000 mg | ORAL_TABLET | Freq: Four times a day (QID) | ORAL | Status: DC | PRN
Start: 2016-02-15 — End: 2016-02-18

## 2016-02-15 MED ORDER — SUFENTANIL CITRATE 50 MCG/ML IV SOLN
INTRAVENOUS | Status: AC
  Filled 2016-02-15: qty 1

## 2016-02-15 MED ORDER — LACTATED RINGERS IV SOLN: INTRAVENOUS | Status: DC

## 2016-02-15 MED ORDER — ONDANSETRON HCL 4 MG PO TABS: 4.0000 mg | ORAL_TABLET | Freq: Four times a day (QID) | ORAL | Status: DC | PRN

## 2016-02-15 MED ORDER — ONDANSETRON HCL 4 MG/2ML IJ SOLN: 4.0000 mg | Freq: Four times a day (QID) | INTRAMUSCULAR | Status: DC | PRN

## 2016-02-15 MED ORDER — CEFAZOLIN SODIUM-DEXTROSE 2-3 GM-% IV SOLR
INTRAVENOUS | Status: AC
  Filled 2016-02-15: qty 50

## 2016-02-15 MED ORDER — METOCLOPRAMIDE HCL 10 MG PO TABS: 5.0000 mg | ORAL_TABLET | Freq: Three times a day (TID) | ORAL | Status: DC | PRN

## 2016-02-15 MED ORDER — ENOXAPARIN SODIUM 40 MG/0.4ML ~~LOC~~ SOLN
40.0000 mg | SUBCUTANEOUS | Status: DC
  Administered 2016-02-16 – 2016-02-18 (×3): 40 mg via SUBCUTANEOUS
  Filled 2016-02-15 (×3): qty 0.4

## 2016-02-15 MED ORDER — DEXAMETHASONE SODIUM PHOSPHATE 10 MG/ML IJ SOLN
INTRAMUSCULAR | Status: AC
  Filled 2016-02-15: qty 1

## 2016-02-15 MED ORDER — LIDOCAINE HCL (CARDIAC) 20 MG/ML IV SOLN
INTRAVENOUS | Status: DC | PRN
  Administered 2016-02-15: 60 mg via INTRAVENOUS

## 2016-02-15 MED ORDER — PHENOL 1.4 % MT LIQD: 1.0000 | OROMUCOSAL | Status: DC | PRN

## 2016-02-15 MED ORDER — SUGAMMADEX SODIUM 200 MG/2ML IV SOLN
INTRAVENOUS | Status: AC
Start: 2016-02-15 — End: 2016-02-15
  Filled 2016-02-15: qty 2

## 2016-02-15 MED ORDER — HYDROMORPHONE HCL 1 MG/ML IJ SOLN
0.2500 mg | INTRAMUSCULAR | Status: DC | PRN
  Administered 2016-02-15: 0.5 mg via INTRAVENOUS
  Administered 2016-02-15 (×2): 0.25 mg via INTRAVENOUS

## 2016-02-15 MED ORDER — CLONIDINE HCL 0.1 MG PO TABS
0.1000 mg | ORAL_TABLET | Freq: Two times a day (BID) | ORAL | Status: DC
  Administered 2016-02-15 – 2016-02-18 (×7): 0.1 mg via ORAL
  Filled 2016-02-15 (×8): qty 1

## 2016-02-15 MED ORDER — ROCURONIUM BROMIDE 100 MG/10ML IV SOLN
INTRAVENOUS | Status: DC | PRN
  Administered 2016-02-15: 20 mg via INTRAVENOUS

## 2016-02-15 MED ORDER — SODIUM CHLORIDE 0.9 % IJ SOLN
INTRAMUSCULAR | Status: AC
  Filled 2016-02-15: qty 10

## 2016-02-15 MED ORDER — MIDAZOLAM HCL 2 MG/2ML IJ SOLN
INTRAMUSCULAR | Status: AC
  Filled 2016-02-15: qty 2

## 2016-02-15 MED ORDER — HYDROMORPHONE HCL 1 MG/ML IJ SOLN
INTRAMUSCULAR | Status: AC
  Filled 2016-02-15: qty 1

## 2016-02-15 MED ORDER — PROPOFOL 10 MG/ML IV BOLUS
INTRAVENOUS | Status: DC | PRN
  Administered 2016-02-15: 90 mg via INTRAVENOUS

## 2016-02-15 MED ORDER — HYDROCODONE-ACETAMINOPHEN 5-325 MG PO TABS: 1.0000 | ORAL_TABLET | Freq: Four times a day (QID) | ORAL | Status: DC | PRN

## 2016-02-15 MED ORDER — MENTHOL 3 MG MT LOZG: 1.0000 | LOZENGE | OROMUCOSAL | Status: DC | PRN

## 2016-02-15 MED ORDER — BUPIVACAINE HCL (PF) 0.25 % IJ SOLN
INTRAMUSCULAR | Status: DC | PRN
  Administered 2016-02-15: 18 mL

## 2016-02-15 MED ORDER — CEFAZOLIN SODIUM-DEXTROSE 2-3 GM-% IV SOLR
2.0000 g | Freq: Once | INTRAVENOUS | Status: AC
  Administered 2016-02-15: 2 g via INTRAVENOUS

## 2016-02-15 SURGICAL SUPPLY — 37 items
BAG ZIPLOCK 12X15 (MISCELLANEOUS) ×3 IMPLANT
BIT DRILL 4.3MMS DISTAL GRDTED (BIT) ×1 IMPLANT
BNDG COHESIVE 4X5 TAN STRL (GAUZE/BANDAGES/DRESSINGS) ×3 IMPLANT
COVER MAYO STAND STRL (DRAPES) ×3 IMPLANT
DRAPE STERI IOBAN 125X83 (DRAPES) ×3 IMPLANT
DRAPE TABLE BACK 44X90 PK DISP (DRAPES) IMPLANT
DRILL 4.3MMS DISTAL GRADUATED (BIT) ×3
DRSG AQUACEL AG ADV 3.5X 4 (GAUZE/BANDAGES/DRESSINGS) ×3 IMPLANT
DRSG AQUACEL AG ADV 3.5X14 (GAUZE/BANDAGES/DRESSINGS) ×3 IMPLANT
DRSG PAD ABDOMINAL 8X10 ST (GAUZE/BANDAGES/DRESSINGS) IMPLANT
DURAPREP 26ML APPLICATOR (WOUND CARE) ×3 IMPLANT
ELECT REM PT RETURN 9FT ADLT (ELECTROSURGICAL) ×3
ELECTRODE REM PT RTRN 9FT ADLT (ELECTROSURGICAL) ×1 IMPLANT
EVACUATOR 1/8 PVC DRAIN (DRAIN) IMPLANT
GAUZE SPONGE 4X4 12PLY STRL (GAUZE/BANDAGES/DRESSINGS) IMPLANT
GAUZE XEROFORM 5X9 LF (GAUZE/BANDAGES/DRESSINGS) ×3 IMPLANT
GLOVE ORTHO TXT STRL SZ7.5 (GLOVE) ×3 IMPLANT
GOWN STRL REUS W/TWL LRG LVL3 (GOWN DISPOSABLE) ×3 IMPLANT
GUIDEPIN 3.2X17.5 THRD DISP (PIN) ×3 IMPLANT
GUIDEWIRE BALL NOSE 100CM (WIRE) ×3 IMPLANT
HIP FRAC NAIL LAG SCR 10.5X100 (Orthopedic Implant) ×2 IMPLANT
KIT BASIN OR (CUSTOM PROCEDURE TRAY) ×3 IMPLANT
NAIL HIP FRACTURE 11X380MM (Nail) ×3 IMPLANT
NEEDLE HYPO 22GX1.5 SAFETY (NEEDLE) ×3 IMPLANT
PACK GENERAL/GYN (CUSTOM PROCEDURE TRAY) ×3 IMPLANT
POSITIONER SURGICAL ARM (MISCELLANEOUS) ×3 IMPLANT
SCREW BONE CORTICAL 5.0X40 (Screw) ×3 IMPLANT
SCREW BONE CORTICAL 5.0X42 (Screw) ×3 IMPLANT
SCREW CANN THRD AFF 10.5X100 (Orthopedic Implant) ×1 IMPLANT
STAPLER VISISTAT 35W (STAPLE) IMPLANT
SUT ETHILON 2 0 PS N (SUTURE) IMPLANT
SUT VIC AB 1 CT1 27 (SUTURE) ×4
SUT VIC AB 1 CT1 27XBRD ANTBC (SUTURE) ×2 IMPLANT
SUT VIC AB 2-0 CT1 27 (SUTURE) ×4
SUT VIC AB 2-0 CT1 TAPERPNT 27 (SUTURE) ×2 IMPLANT
SYR 30ML LL (SYRINGE) ×3 IMPLANT
TOWEL OR 17X26 10 PK STRL BLUE (TOWEL DISPOSABLE) ×3 IMPLANT

## 2016-02-15 NOTE — H&P (View-Only) (Signed)
Patient ID: Alexis Wang MRN: [id number] DOB/AGE: 1951-02-27 65 y.o.  Admit date: 02/14/2016  Admission Diagnoses:  Principal Problem:   Intertrochanteric fracture of femur (HCC) Active Problems:   Diabetes mellitus without complication (HCC)   Alcohol dependency (HCC)   Hyponatremia   Fall at home   Diet-controlled diabetes mellitus (HCC)   HPI: Patient being seen at the request of the hospitalist for right hip IT fracture.  Ambulates with a walker for chronic left hip issues and got her feet tangled up very early this AM and fell onto her right hip.  Unable to weightbear.  Hx of etoh abuse.  Lives in [place] and is here visiting son for a wedding.  Was expected to fly back home tomorrow.     Past Medical History: Past Medical History  Diagnosis Date  . Diabetes mellitus without complication (HCC)   . HTN (hypertension)   . OA (osteoarthritis)   . Pelvic fracture (HCC) 2015  . Alcohol dependency [facility])     Surgical History: Past Surgical History  Procedure Laterality Date  . Appendectomy      Family History: Family History  Problem Relation Age of Onset  . Diabetes Son   . Cancer Mother     Died of Colon Cancer    Social History: Social History   Social History  . Marital Status: Divorced    Spouse Name: N/A  . Number of Children: 2  . Years of Education: N/A   Occupational History  . Retired IT trainer    Social History Main Topics  . Smoking status: Current Every Day Smoker -- 0.50 packs/day for 25 years    Types: Cigarettes  . Smokeless tobacco: Not on file  . Alcohol Use: 0.0 oz/week    0 Standard drinks or equivalent per week     Comment: 2 beers a day, 2-3 mixed drinks at night.  . Drug Use: No  . Sexual Activity: Not on file   Other Topics Concern  . Not on file   Social History Narrative   Visiting son from [place].  Lives alone.  Divorced with 2 children who live out of state, son in [place], daughter in [place].     Allergies: Penicillins  Medications: I have reviewed the patient's current medications.  Vital Signs: Patient Vitals for the past 24 hrs:  BP Temp Temp src Pulse Resp SpO2 Height Weight  02/14/16 0949 (!) 160/82 mmHg - - 88 - - - -  02/14/16 0844 (!) 199/107 mmHg 98.1 F (36.7 C) Oral (!) 116 20 100 %  (1.651 m) 47.628 kg (105 lb)  02/14/16 0739 139/79 mmHg - - - - - - -  02/14/16 0706 145/86 mmHg - - 98 16 99 % - -  02/14/16 0431 160/88 mmHg 98.2 F (36.8 C) Oral 96 16 98 % - -  02/14/16 0416 148/82 mmHg 98.2 F (36.8 C) Oral 92 20 98 % - -    Radiology: Dg Chest 1 View  02/14/2016  CLINICAL DATA:  Larey Seat in living room tonight.  RIGHT hip pain. EXAM: CHEST 1 VIEW COMPARISON:  None. FINDINGS: The heart size and mediastinal contours are within normal limits. Mild calcific atherosclerosis of the descending aorta. Both lungs are clear. The visualized skeletal structures are unremarkable. IMPRESSION: No acute cardiopulmonary process. Electronically Signed   By: Awilda Metro M.D.   On: 02/14/2016 06:39   Dg Hip Unilat With Pelvis 2-3 Views Right  02/14/2016  CLINICAL DATA:  Larey Seat in living room  tonight. RIGHT hip pain. EXAM: DG HIP (WITH OR WITHOUT PELVIS) 2-3V RIGHT COMPARISON:  None. FINDINGS: Acute RIGHT femur intertrochanteric fracture with mild impaction, in general alignment. Age indeterminate LEFT superior pubic ramus nondisplaced fracture. No dislocation. Osteopenia without destructive bony lesions. Severe degenerative change of the LEFT hip with mild femoral head collapse. Soft tissue planes are nonsuspicious. IMPRESSION: Acute mildly impacted RIGHT femur intertrochanteric fracture. No dislocation. 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 Age indeterminate LEFT superior pubic ramus nondisplaced fracture. Electronically Signed   By: Awilda Metroourtnay  Bloomer M.D.   On: 02/14/2016 06:41    Labs:  Recent Labs  02/14/16 0515  WBC 6.5   RBC 3.86*  HCT 36.5  PLT 365    Recent Labs  02/14/16 0515  NA 127*  K 3.9  CL 90*  CO2 20*  BUN 6  CREATININE 0.60  GLUCOSE 71  CALCIUM 10.1    Recent Labs  02/14/16 0515  INR 0.97    Review of Systems: Review of Systems  Constitutional: Negative.   HENT: Negative.   Respiratory: Negative.   Cardiovascular: Negative.   Gastrointestinal: Negative.   Genitourinary: Negative.   Musculoskeletal: Positive for joint pain.  Psychiatric/Behavioral: Negative.     Physical Exam: Alert and oriented.  Head normocephalic.  Cervical spine unremarkable.  abd flat, nondistended. No respiratory distress.  bilat calves nontender.  Nvi.  Skin warm and dry.  Pain right hip with attempted movement.   Assessment and Plan: Right hip IT fracture End stage djd left Hip.  Question AVN. Hx of ETOH abuse  Discussed with patient that best treatment option for right hip is ORIF procedure.  This was discussed with patient.  Family members not present.  Dr Ophelia CharterYates will plan for OR tomorrow afternoon.  Advised patient that she will very likely need snf placement for rehab due to chronic issue with left hip.  Lives in [place] and has been advised by orthopedic surgeon there that she needs THR. All questions answered. NPO after midnight.   Genene ChurnJames M. Jeanella Crazewens PA-C Annell GreeningMark Pelham Hennick MD [facility]  Risks of alcohol withdrawal while in hospital . 2 beers in AM and 4 plus vodka drinks in afternoon/ evening. Had left hip Fx a few years ago, in hospital with no DT/s.  Discussed procedure, risks. Surgery Tuesday at 5 PM.  NPO after clear liquid breakfast.

## 2016-02-15 NOTE — Progress Notes (Signed)
Initial Nutrition Assessment  DOCUMENTATION CODES:   Severe malnutrition in context of chronic illness, Underweight  INTERVENTION:   Diet advancement per MD Once diet is advanced, provide daily snacks (vanilla Yoplait yogurt w/ pineapples) RD to continue to monitor  NUTRITION DIAGNOSIS:   Increased nutrient needs related to other (see comment) (hip fracture, ETOH abuse) as evidenced by estimated needs.  GOAL:   Patient will meet greater than or equal to 90% of their needs  MONITOR:   Diet advancement, Labs, Weight trends, I & O's  REASON FOR ASSESSMENT:   Other (Comment) (Low BMI)    ASSESSMENT:  65 y.o. female with a PMH of diet controlled diabetes, alcohol dependence, tobacco abuse and hypertension who is visiting her son from out of town, and presented to the ED this morning after a mechanical fall. Patient ambulates with a walker and reports that she got her feet tangled up and fell, landing on her right side. She had severe pain in the right hip and was unable to ambulate or bear weight after the fall. Movement exacerbates the pain. Morphine, given in the ED, has not appreciably helped her pain. Denies any loss of consciousness. Patient admits to drinking 2 beers in the morning, and 2-3 mixed drinks in the evening.   Patient in room with son at bedside. Pt very lethargic and was unable to provide much history. Per son, pt does not eat much of her meals and is very picky about what she eats. He states she consumes mainly vegetables and fruit. For breakfast, she may have french toast and bacon, then for dinner seafood, soups and/or vegetables. Pt does like yogurt but only Yoplait brand. RD to try and provide Yoplait yogurt as a snack once diet is advanced. Pt currently NPO for hip surgery this evening. Pt was allowed clear liquids this morning but did not wake in time to consume any before diet changed to NPO. Pt declines ensure/boost supplements. Per son, she won't likely drink  [employer] Instant Breakfast either. Pt drinks alcohol daily, 2 beers in the am and 4+ vodka drinks in evening.  Encouraged focus be on eating adequate amounts of protein and small, frequent meals. Pt tends to only eat when hungry, encouraged eating by schedule.   Pt's weight has remained stable per reported UBW of 105 lb. Pt's son states she lost weight ~2 years ago when she broke her hip on the opposite side.  Nutrition-Focused physical exam completed. Findings are severe fat depletion, severe muscle depletion, and no edema.   Medications: OSCAL w/ Vit D daily, folic acid tablet daily, Multivitamin with minerals daily, thiamine tablet daily  Labs reviewed: CBGs: 66-124  Low Na  Diet Order:  Diet NPO time specified Except for: Sips with Meds  Skin:  Wound (see comment) (hand wound)  Last BM:  3/26  Height:   Ht Readings from Last 1 Encounters:  02/14/16 5\' 5"  (1.651 m)    Weight:   Wt Readings from Last 1 Encounters:  02/14/16 105 lb (47.628 kg)    Ideal Body Weight:  56.8 kg  BMI:  Body mass index is 17.47 kg/(m^2).  Estimated Nutritional Needs:   Kcal:  1500-1700  Protein:  75-85g  Fluid:  1.7L/day  EDUCATION NEEDS:   No education needs identified at this time  Tilda FrancoLindsey Ismelda Weatherman, MS, RD, LDN Pager: [phone] After Hours Pager: [phone]

## 2016-02-15 NOTE — Anesthesia Procedure Notes (Signed)
Procedure Name: Intubation Date/Time: 02/15/2016 5:36 PM Performed by: Leroy LibmanEARDON, Alexis Wang Patient Re-evaluated:Patient Re-evaluated prior to inductionOxygen Delivery Method: Circle system utilized Preoxygenation: Pre-oxygenation with 100% oxygen Intubation Type: IV induction Ventilation: Mask ventilation without difficulty Laryngoscope Size: Miller and 2 Grade View: Grade II Tube size: 7.0 mm Number of attempts: 1 Airway Equipment and Method: Stylet Placement Confirmation: ETT inserted through vocal cords under direct vision,  breath sounds checked- equal and bilateral and positive ETCO2 Secured at: 19 cm Tube secured with: Tape Dental Injury: Injury to lip and Teeth and Oropharynx as per pre-operative assessment

## 2016-02-15 NOTE — Brief Op Note (Signed)
02/14/2016 - 02/15/2016  6:26 PM  PATIENT:  Roland Earliane Haverland  65 y.o. female  PRE-OPERATIVE DIAGNOSIS:  RIGHT INTERTROCHANTERIC HIP FRACTURE  POST-OPERATIVE DIAGNOSIS:  RIGHT INTERTROCHANTERIC HIP FRACTURE  PROCEDURE:  Procedure(s): INTRAMEDULLARY (IM) NAIL FEMORAL (Right)  SURGEON:  Surgeon(s) and Role:    * Eldred MangesMark C Hadli Vandemark, MD - Primary  PHYSICIAN ASSISTANT:   ASSISTANTS: none   ANESTHESIA:   local and general  EBL:  Total I/O In: 161 [P.O.:161] Out: 375 [Urine:325; Blood:50]  BLOOD ADMINISTERED:none  DRAINS: none   LOCAL MEDICATIONS USED:  MARCAINE     SPECIMEN:  No Specimen  DISPOSITION OF SPECIMEN:  N/A  COUNTS:  YES  TOURNIQUET:  * No tourniquets in log *  DICTATION: .Other Dictation: Dictation Number [id number]  PLAN OF CARE: already inpt  PATIENT DISPOSITION:  PACU - hemodynamically stable.   Delay start of Pharmacological VTE agent (>24hrs) due to surgical blood loss or risk of bleeding: yes

## 2016-02-15 NOTE — Interval H&P Note (Signed)
History and Physical Interval Note:  02/15/2016 5:32 PM  Alexis Wang  has presented today for surgery, with the diagnosis of RIGHT INTERTROCHANTERIC HIP FRACTURE  The various methods of treatment have been discussed with the patient and family. After consideration of risks, benefits and other options for treatment, the patient has consented to  Procedure(s): INTRAMEDULLARY (IM) NAIL FEMORAL (Right) as a surgical intervention .  The patient's history has been reviewed, patient examined, no change in status, stable for surgery.  I have reviewed the patient's chart and labs.  Questions were answered to the patient's satisfaction.     [name]

## 2016-02-15 NOTE — Transfer of Care (Signed)
Immediate Anesthesia Transfer of Care Note  Patient: Alexis EarlDiane Bangs  Procedure(s) Performed: Procedure(s): INTRAMEDULLARY (IM) NAIL FEMORAL (Right)  Patient Location: PACU  Anesthesia Type:General  Level of Consciousness: awake  Airway & Oxygen Therapy: Patient Spontanous Breathing and Patient connected to face mask oxygen  Post-op Assessment: Report given to RN and Post -op Vital signs reviewed and stable  Post vital signs: Reviewed and stable  Last Vitals:  Filed Vitals:   02/15/16 1050 02/15/16 1351  BP: 160/86 142/80  Pulse: 106 107  Temp:  36.6 C  Resp:  15    Complications: No apparent anesthesia complications

## 2016-02-15 NOTE — Anesthesia Preprocedure Evaluation (Addendum)
Anesthesia Evaluation  Patient identified by MRN, date of birth, ID band Patient awake    Reviewed: Allergy & Precautions, H&P , NPO status , Patient's Chart, lab work & pertinent test results  Airway Mallampati: II  TM Distance: >3 FB Neck ROM: full    Dental  (+) Dental Advisory Given, Poor Dentition, Chipped Left upper front broken off in middle:   Pulmonary neg pulmonary ROS, Current Smoker,    Pulmonary exam normal breath sounds clear to auscultation       Cardiovascular hypertension, Pt. on medications Normal cardiovascular exam Rhythm:regular Rate:Normal     Neuro/Psych negative neurological ROS  negative psych ROS   GI/Hepatic negative GI ROS, Neg liver ROS, (+)     substance abuse  alcohol use,   Endo/Other  diabetes, Poorly Controlled, Type 2Diet controlled DM  Renal/GU negative Renal ROS  negative genitourinary   Musculoskeletal   Abdominal   Peds  Hematology negative hematology ROS (+)   Anesthesia Other Findings   Reproductive/Obstetrics negative OB ROS                            Anesthesia Physical Anesthesia Plan  ASA: III  Anesthesia Plan: General   Post-op Pain Management:    Induction: Intravenous  Airway Management Planned: Oral ETT  Additional Equipment:   Intra-op Plan:   Post-operative Plan: Extubation in OR  Informed Consent: I have reviewed the patients History and Physical, chart, labs and discussed the procedure including the risks, benefits and alternatives for the proposed anesthesia with the patient or authorized representative who has indicated his/her understanding and acceptance.   Dental Advisory Given  Plan Discussed with: CRNA and Surgeon  Anesthesia Plan Comments:         Anesthesia Quick Evaluation

## 2016-02-15 NOTE — Clinical Social Work Placement (Signed)
   CLINICAL SOCIAL WORK PLACEMENT  NOTE  Date:  02/15/2016  Patient Details  Name: Alexis Wang MRN: [id number] Date of Birth: 11/22/1950  Clinical Social Work is seeking post-discharge placement for this patient at the Skilled  Nursing Facility level of care (*CSW will initial, date and re-position this form in  chart as items are completed):  Yes   Patient/family provided with [facility] Clinical Social Work Department's list of facilities offering this level of care within the geographic area requested by the patient (or if unable, by the patient's family).  Yes   Patient/family informed of their freedom to choose among providers that offer the needed level of care, that participate in Medicare, Medicaid or managed care program needed by the patient, have an available bed and are willing to accept the patient.  Yes   Patient/family informed of [facility]'s ownership interest in [facility] and [facility], as well as of the fact that they are under no obligation to receive care at these facilities.  PASRR submitted to EDS on       PASRR number received on       Existing PASRR number confirmed on       FL2 transmitted to all facilities in geographic area requested by pt/family on 02/15/16     FL2 transmitted to all facilities within larger geographic area on       Patient informed that his/her managed care company has contracts with or will negotiate with certain facilities, including the following:            Patient/family informed of bed offers received.  Patient chooses bed at       Physician recommends and patient chooses bed at      Patient to be transferred to   on  .  Patient to be transferred to facility by       Patient family notified on   of transfer.  Name of family member notified:        PHYSICIAN       Additional Comment:    _______________________________________________ Royetta AsalHaidinger, Ragena Fiola Lee, Alexander MtLCSW  [phone] 02/15/2016, 2:46 PM

## 2016-02-15 NOTE — Clinical Social Work Note (Signed)
Clinical Social Work Assessment  Patient Details  Name: Alexis Wang MRN: [id number] Date of Birth: 03/09/1951  Date of referral:  02/15/16               Reason for consult:  Facility Placement, Discharge Planning                Permission sought to share information with:  Chartered certified accountant granted to share information::  Yes, Verbal Permission Granted  Name::        Agency::     Relationship::     Contact Information:     Housing/Transportation Living arrangements for the past 2 months:  Single Family Home Source of Information:  Adult Children, Patient Patient Interpreter Needed:  None Criminal Activity/Legal Involvement Pertinent to Current Situation/Hospitalization:  No - Comment as needed Significant Relationships:  Adult Children Lives with:  Self Do you feel safe going back to the place where you live?  No (SNF needed.) Need for family participation in patient care:  Yes (Comment)  Care giving concerns:  Pt will need ST Rehab following hospital d/c. Pt's son is unable to manage pt's care at his home.    Social Worker assessment / plan:  Pt hospitalized on 02/14/16 following a fall which resulted in a femur fx. Pt will have surgery today. Pt is from [place] visiting family. CSW met with pt / son to assist with d/c planning. Son is requesting ST rehab placement for pt after surgery. Pt is pvt pay. SNF search has been initiated and bed offers pending. CSW will continue to follow to assist with d/c planning needs.  Employment status:  Retired Forensic scientist:  Self Pay (Medicaid Pending) PT Recommendations:  [place] / Referral to community resources:  [place]  Patient/Family's Response to care:  Pt / son fell ST Rehab is needed.  Patient/Family's Understanding of and Emotional Response to Diagnosis, Current Treatment, and Prognosis:  Pt / son are aware of pt's medical status. " It's unfortunate that this  has happened, again. My mother will have to go to rehab and then stay with me until she can travel back to [place]. "  Emotional Assessment Appearance:  Appears stated age Attitude/Demeanor/Rapport:  Other (cooperative) Affect (typically observed):  Calm, Appropriate Orientation:  Oriented to Self, Oriented to Place, Oriented to  Time, Oriented to Situation Alcohol / Substance use:  Alcohol Use Psych involvement (Current and /or in the community):  No (Comment)  Discharge Needs  Concerns to be addressed:  Discharge Planning Concerns Readmission within the last 30 days:  No Current discharge risk:  None Barriers to Discharge:  No Barriers Identified   Loraine Maple  [phone] 02/15/2016, 2:38 PM

## 2016-02-15 NOTE — NC FL2 (Signed)
[place] MEDICAID FL2 LEVEL OF CARE SCREENING TOOL     IDENTIFICATION  Patient Name: Alexis Wang Birthdate: 1951/09/29 Sex: female Admission Date (Current Location): 02/14/2016  [facility] [place]   ([place] of [place])   Facility and Address:  [facility],  [address], [place] [id number]      Provider Number:    Attending Physician Name and Address:  Vassie Lollarlos Madera, MD  Relative Name and Phone Number:       Current Level of Care: Hospital Recommended Level of Care: Skilled Nursing Facility Prior Approval Number:    Date Approved/Denied:   PASRR Number:    Discharge Plan: SNF    Current Diagnoses: Patient Active Problem List   Diagnosis Date Noted  . Intertrochanteric fracture of femur (HCC) 02/14/2016  . Alcohol dependency (HCC) 02/14/2016  . Hyponatremia 02/14/2016  . Fall at home 02/14/2016  . Diet-controlled diabetes mellitus (HCC) 02/14/2016  . Diabetes mellitus without complication (HCC)   . HTN (hypertension)   . OA (osteoarthritis)   . Essential hypertension   . Arthritis, senescent     Orientation RESPIRATION BLADDER Height & Weight     Self, Time, Situation, Place  Normal Continent Weight: 47.628 kg (105 lb) Height:  5\' 5"  (165.1 cm)  BEHAVIORAL SYMPTOMS/MOOD NEUROLOGICAL BOWEL NUTRITION STATUS  Other (Comment) (no behaviors)   Continent Diet  AMBULATORY STATUS COMMUNICATION OF NEEDS Skin   Limited Assist Verbally Surgical wounds                       Personal Care Assistance Level of Assistance  Bathing, Feeding, Dressing Bathing Assistance: Limited assistance Feeding assistance: Independent Dressing Assistance: Limited assistance     Functional Limitations Info  Sight, Hearing, Speech Sight Info: Adequate Hearing Info: Adequate Speech Info: Adequate    SPECIAL CARE FACTORS FREQUENCY  PT (By licensed PT)     PT Frequency: 5 -6 x wk              Contractures Contractures Info: Not present     Additional Factors Info  Code Status, Psychotropic, Insulin Sliding Scale Code Status Info: Full Code   Psychotropic Info: ativan Insulin Sliding Scale Info: 4 x daily       Current Medications (02/15/2016):  This is the current hospital active medication list Current Facility-Administered Medications  Medication Dose Route Frequency Provider Last Rate Last Dose  . 0.9 %  sodium chloride infusion   Intravenous Continuous Maryruth Bunhristina P Rama, MD 75 mL/hr at 02/14/16 2220    . calcium-vitamin D (OSCAL WITH D) 500-200 MG-UNIT per tablet 1 tablet  1 tablet Oral Daily Maryruth Bunhristina P Rama, MD   1 tablet at 02/15/16 1047  . cloNIDine (CATAPRES) tablet 0.1 mg  0.1 mg Oral BID Vassie Lollarlos Madera, MD   0.1 mg at 02/15/16 1047  . enoxaparin (LOVENOX) injection 40 mg  40 mg Subcutaneous Q24H Maryruth Bunhristina P Rama, MD   40 mg at 02/14/16 1006  . folic acid (FOLVITE) tablet 1 mg  1 mg Oral Daily Maryruth Bunhristina P Rama, MD   1 mg at 02/15/16 1047  . HYDROmorphone (DILAUDID) injection 1 mg  1 mg Intravenous Q2H PRN Gilda Creasehristopher J Pollina, MD   1 mg at 02/14/16 0734  . insulin aspart (novoLOG) injection 0-5 Units  0-5 Units Subcutaneous QHS Maryruth Bunhristina P Rama, MD   0 Units at 02/14/16 2220  . insulin aspart (novoLOG) injection 0-9 Units  0-9 Units Subcutaneous TID WC Christina P Rama,  MD   0 Units at 02/14/16 1250  . LORazepam (ATIVAN) tablet 1 mg  1 mg Oral Q6H PRN Maryruth Bun Rama, MD   1 mg at 02/15/16 0829   Or  . LORazepam (ATIVAN) injection 1 mg  1 mg Intravenous Q6H PRN Christina P Rama, MD      . methocarbamol (ROBAXIN) 500 mg in dextrose 5 % 50 mL IVPB  500 mg Intravenous Q6H PRN Maryruth Bun Rama, MD   500 mg at 02/14/16 1634  . oxyCODONE (Oxy IR/ROXICODONE) immediate release tablet 5-10 mg  5-10 mg Oral Q4H PRN Maryruth Bun Rama, MD   10 mg at 02/15/16 0829  . polyethylene glycol (MIRALAX / GLYCOLAX) packet 17 g  17 g Oral Daily PRN Maryruth Bun Rama, MD      . prenatal multivitamin tablet 1 tablet  1 tablet Oral Q1200  Maryruth Bun Rama, MD   1 tablet at 02/14/16 1005  . senna (SENOKOT) tablet 8.6 mg  1 tablet Oral BID Maryruth Bun Rama, MD   8.6 mg at 02/15/16 1047  . temazepam (RESTORIL) capsule 15 mg  15 mg Oral QHS PRN,MR X 1 Christina P Rama, MD      . thiamine (VITAMIN B-1) tablet 100 mg  100 mg Oral Daily Maryruth Bun Rama, MD   100 mg at 02/15/16 1047   Or  . thiamine (B-1) injection 100 mg  100 mg Intravenous Daily Maryruth Bun Rama, MD         Discharge Medications: Please see discharge summary for a list of discharge medications.  Relevant Imaging Results:  Relevant Lab Results:   Additional Information ss # [id number]  Tovia Kisner, Dickey Gave, LCSW

## 2016-02-15 NOTE — Progress Notes (Addendum)
[facility] PROGRESS NOTE  Alexis Wang WJX:[id number] DOB: 02-Nov-1951 DOA: 02/14/2016 PCP: No primary care provider on file.  Assessment/Plan: Intertrochanteric fracture of femur (HCC)Secondary to mechanical fall - will follow ortho recommendations -surgery planned for later today 3/28 -analgesics and DVT prophylaxis as per orthopedic service rec's -will follow clearance and evaluation from PT   Diet controlled diabetes/diabetes mellitus without complication - A1C 4.8 -continue SSI while inpatient; no meal coverage and no long acting  -ok to continue diet management at discharge -CBG's in 79-120   Alcohol dependency (HCC) - continue CIWA - Supplement thiamine and folic acid. - follow response and need for librium or further meds    Hyponatremia - Suspect this is secondary to beer drinkers potomania. - Hydrate and monitor NA trend -slowly increasing (128 on 3/28)   Bilateral hip osteoarthritis - Check vitamin D levels. -will need vit D and calcium at discharge    Tobacco abuse -cessation counseling reinforced -patient endorses sh has quit and declined nicotine patch     Severe protein calorie malnutrition  -will follow nutritional service rec's   Code Status: Full Family Communication: no family at bedside  Disposition Plan: surgery later today; PT evaluation needed after surgery to help determine discharge plans    Consultants:  Orthopedic service   Procedures:  Plan is for right hip ORIF later today (02/15/16)  Antibiotics:  None   HPI/Subjective: Afebrile, no CP and no SOB. Patient is lethargic (probably from benzo's and analgesics); easily arouse and complaining of right hip pain.   Objective: Filed Vitals:   02/15/16 1050 02/15/16 1351  BP: 160/86 142/80  Pulse: 106 107  Temp:  97.8 F (36.6 C)  Resp:  15    Intake/Output Summary (Last 24 hours) at 02/15/16 1747 Last data filed at 02/15/16 1351  Gross per 24 hour  Intake 1662.25 ml   Output   1075 ml  Net 587.25 ml   Filed Weights   02/14/16 0844  Weight: 47.628 kg (105 lb)    Exam:   General:  Somnolent, but easily arouse. Reports pain in her right hip. No CP, no SOB. Patient is thin/chronically ill in appearance. No fever  Cardiovascular: S1 and S2, no rubs or gallops  Respiratory: good air movement, no wheezing  Abdomen: soft, NT, ND, positive BS  Musculoskeletal: no edema, no cyanosis; RLE externally rotated   Data Reviewed: Basic Metabolic Panel:  Recent Labs Lab 02/14/16 0515 02/15/16 0421  NA 127* 128*  K 3.9 4.0  CL 90* 95*  CO2 20* 21*  GLUCOSE 71 120*  BUN 6 6  CREATININE 0.60 0.50  CALCIUM 10.1 9.4   Liver Function Tests:  Recent Labs Lab 02/14/16 0515  AST 48*  ALT 25  ALKPHOS 136*  BILITOT 0.8  PROT 7.4  ALBUMIN 3.7   CBC:  Recent Labs Lab 02/14/16 0515  WBC 6.5  NEUTROABS 5.1  HGB 13.5  HCT 36.5  MCV 94.6  PLT 365   ProBNP (last 3 results) No results for input(s): PROBNP in the last 8760 hours.  CBG:  Recent Labs Lab 02/14/16 0858 02/14/16 2213 02/15/16 0756 02/15/16 1146  GLUCAP 66 114* 124* 134*    Recent Results (from the past 240 hour(s))  Surgical pcr screen     Status: None   Collection Time: 02/14/16  1:15 PM  Result Value Ref Range Status   MRSA, PCR NEGATIVE NEGATIVE Final   Staphylococcus aureus NEGATIVE NEGATIVE Final    Comment:  The Xpert SA Assay (FDA approved for NASAL specimens in patients over 921 years of age), is one component of a comprehensive surveillance program.  Test performance has been validated by [facility] for patients greater than or equal to 63 year old. It is not intended to diagnose infection nor to guide or monitor treatment.      Studies: Dg Chest 1 View  02/14/2016  CLINICAL DATA:  Larey SeatFell in living room tonight.  RIGHT hip pain. EXAM: CHEST 1 VIEW COMPARISON:  None. FINDINGS: The heart size and mediastinal contours are within normal limits.  Mild calcific atherosclerosis of the descending aorta. Both lungs are clear. The visualized skeletal structures are unremarkable. IMPRESSION: No acute cardiopulmonary process. Electronically Signed   By: Awilda Metroourtnay  Bloomer M.D.   On: 02/14/2016 06:39   Dg Hip Unilat With Pelvis 2-3 Views Right  02/14/2016  CLINICAL DATA:  Larey SeatFell in living room tonight. RIGHT hip pain. EXAM: DG HIP (WITH OR WITHOUT PELVIS) 2-3V RIGHT COMPARISON:  None. FINDINGS: Acute RIGHT femur intertrochanteric fracture with mild impaction, in general alignment. Age indeterminate LEFT superior pubic ramus nondisplaced fracture. No dislocation. Osteopenia without destructive bony lesions. Severe degenerative change of the LEFT hip with mild femoral head collapse. Soft tissue planes are nonsuspicious. IMPRESSION: Acute mildly impacted RIGHT femur intertrochanteric fracture. No dislocation. 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 Age indeterminate LEFT superior pubic ramus nondisplaced fracture. Electronically Signed   By: Awilda Metroourtnay  Bloomer M.D.   On: 02/14/2016 06:41    Scheduled Meds: . [MAR Hold] calcium-vitamin D  1 tablet Oral Daily  . [MAR Hold] cloNIDine  0.1 mg Oral BID  . [MAR Hold] enoxaparin (LOVENOX) injection  40 mg Subcutaneous Q24H  . [MAR Hold] folic acid  1 mg Oral Daily  . [MAR Hold] insulin aspart  0-5 Units Subcutaneous QHS  . [MAR Hold] insulin aspart  0-9 Units Subcutaneous TID WC  . [MAR Hold] prenatal multivitamin  1 tablet Oral Q1200  . [MAR Hold] senna  1 tablet Oral BID  . [MAR Hold] thiamine  100 mg Oral Daily   Or  . [MAR Hold] thiamine  100 mg Intravenous Daily   Continuous Infusions: . sodium chloride 75 mL/hr at 02/15/16 1337    Principal Problem:   Intertrochanteric fracture of femur (HCC) Active Problems:   Diabetes mellitus without complication (HCC)   Alcohol dependency (HCC)   Hyponatremia   Fall at home   Diet-controlled diabetes mellitus  (HCC)   Protein-calorie malnutrition, severe    Time spent: 30 minutes    Vassie LollMadera, Tasneem Cormier  [facility] Pager [phone]. If 7PM-7AM, please contact night-coverage at [email or web address], password [email or web address] 02/15/2016, 5:47 PM  LOS: 1 day

## 2016-02-16 ENCOUNTER — Encounter (HOSPITAL_COMMUNITY): Payer: Self-pay | Admitting: Orthopaedic Surgery

## 2016-02-16 LAB — BASIC METABOLIC PANEL
Anion gap: 9 (ref 5–15)
CALCIUM: 9.3 mg/dL (ref 8.9–10.3)
CO2: 22 mmol/L (ref 22–32)
Chloride: 99 mmol/L — ABNORMAL LOW (ref 101–111)
Creatinine, Ser: 0.51 mg/dL (ref 0.44–1.00)
GFR calc Af Amer: >60 mL/min (ref >60)
GLUCOSE: 215 mg/dL — AB (ref 65–99)
POTASSIUM: 3.8 mmol/L (ref 3.5–5.1)
Sodium: 130 mmol/L — ABNORMAL LOW (ref 135–145)

## 2016-02-16 LAB — CBC
HCT: 25.3 % — ABNORMAL LOW (ref 36.0–46.0)
Hemoglobin: 9.1 g/dL — ABNORMAL LOW (ref 12.0–15.0)
MCH: 34.7 pg — AB (ref 26.0–34.0)
MCHC: 36 g/dL (ref 30.0–36.0)
MCV: 96.6 fL (ref 78.0–100.0)
PLATELETS: 189 10*3/uL (ref 150–400)
RBC: 2.62 MIL/uL — ABNORMAL LOW (ref 3.87–5.11)
RDW: 13.3 % (ref 11.5–15.5)
WBC: 6.6 10*3/uL (ref 4.0–10.5)

## 2016-02-16 LAB — GLUCOSE, CAPILLARY
GLUCOSE-CAPILLARY: 187 mg/dL — AB (ref 65–99)
GLUCOSE-CAPILLARY: 147 mg/dL — AB (ref 65–99)
Glucose-Capillary: 115 mg/dL — ABNORMAL HIGH (ref 65–99)
Glucose-Capillary: 117 mg/dL — ABNORMAL HIGH (ref 65–99)

## 2016-02-16 MED ORDER — ASPIRIN 325 MG PO TABS: 325.0000 mg | ORAL_TABLET | Freq: Every day | ORAL | Status: DC

## 2016-02-16 MED ORDER — OXYCODONE-ACETAMINOPHEN 5-325 MG PO TABS: 1.0000 | ORAL_TABLET | Freq: Four times a day (QID) | ORAL | Status: AC | PRN

## 2016-02-16 MED ORDER — OXYCODONE HCL 5 MG PO TABS
5.0000 mg | ORAL_TABLET | ORAL | Status: DC | PRN
  Administered 2016-02-16 – 2016-02-18 (×13): 5 mg via ORAL
  Filled 2016-02-16 (×13): qty 1

## 2016-02-16 MED ORDER — HYDROCODONE-ACETAMINOPHEN 5-325 MG PO TABS: 1.0000 | ORAL_TABLET | Freq: Four times a day (QID) | ORAL | Status: DC | PRN

## 2016-02-16 NOTE — Progress Notes (Signed)
Patient is very lethargic at this time and somewhat verbally aggressive stating "don't force pills down my throat. This nurse is attempting to administer one pill at a time. Patient very slow in her movements and falling asleep in the middle of sentences. Will continue to monitor.

## 2016-02-16 NOTE — Progress Notes (Addendum)
Subjective: 1 Day Post-Op Procedure(s) (LRB): INTRAMEDULLARY (IM) NAIL FEMORAL (Right) Patient reports pain as moderate.  PATIENT IS SLURRING HER WORDS, CLOSING HER EYES. OVERSEDATED. WAS ALERT ON ADMISSION. GOT OXY 10MG  LAST NIGHT.   Objective: Vital signs in last 24 hours: Temp:  [97.5 F (36.4 C)-98.5 F (36.9 C)] 98.1 F (36.7 C) (03/29 0540) Pulse Rate:  [93-110] 93 (03/29 0540) Resp:  [9-16] 15 (03/29 0540) BP: (115-169)/(73-108) 134/74 mmHg (03/29 0540) SpO2:  [93 %-100 %] 99 % (03/29 0540)  Intake/Output from previous day: 03/28 0701 - 03/29 0700 In: 1117.3 [P.O.:221; I.V.:896.3] Out: 1175 [Urine:1125; Blood:50] Intake/Output this shift:     Recent Labs  02/14/16 0515 02/15/16 2207 02/16/16 0405  HGB 13.5 10.1* 9.1*    Recent Labs  02/15/16 2207 02/16/16 0405  WBC 6.9 6.6  RBC 2.92* 2.62*  HCT 28.4* 25.3*  PLT 188 189    Recent Labs  02/15/16 0421 02/15/16 2207 02/16/16 0405  NA 128*  --  130*  K 4.0  --  3.8  CL 95*  --  99*  CO2 21*  --  22  BUN 6  --  <5*  CREATININE 0.50 0.54 0.51  GLUCOSE 120*  --  215*  CALCIUM 9.4  --  9.3    Recent Labs  02/14/16 0515  INR 0.97    Neurologically intact  Assessment/Plan: 1 Day Post-Op Procedure(s) (LRB): INTRAMEDULLARY (IM) NAIL FEMORAL (Right) Up with therapy  .    I HAVE STOPPED ATIVAN , STOPPED IV ROBAXIN, REDUCED OXYCODONE TO 5MG   . STOPPED DILAUDID.  HAS NOT GONE INTO DT'S SO DOES NOT NEED ATIVAN AT THIS TIME.     Shamyra Farias C 02/16/2016, 8:09 AM

## 2016-02-16 NOTE — Evaluation (Signed)
Occupational Therapy Evaluation Patient Details Name: Alexis EarlDiane Wang MRN: [id number] DOB: 1950/12/04 Today's Date: 02/16/2016    History of Present Illness pt was visiting from [place] and sustained a fall resulting in R intertrochanteric fx. She is s/p IM nail.  PMH significant for ETOH   Clinical Impression   This 65 year old female was admitted for the above. She will benefit from continued OT to increase safety and independence with adls.  Pt was mod I prior to admission.  She was limited by pain and variable alertness during evaluation.  She needed max +2 for all mobility and was unsafe to perform SPT at this time.  UB adls impaired by alertness and she needs +2 assistance for LB adls. Goals in acute are for mod +2 assistance overall    Follow Up Recommendations  SNF    Equipment Recommendations  3 in 1 bedside comode    Recommendations for Other Services       Precautions / Restrictions Precautions Precautions: Fall Restrictions Weight Bearing Restrictions: No RLE Weight Bearing: Weight bearing as tolerated      Mobility Bed Mobility Overal bed mobility: +2 for physical assistance;Needs Assistance Bed Mobility: Supine to Sit     Supine to sit: Max assist;+2 for physical assistance     General bed mobility comments: extra time, cues for sequence; assistance to lift legs to weight shift as she did not have a pad under her hips  Transfers Overall transfer level: Needs assistance Equipment used: Rolling walker (2 wheeled) Transfers: Sit to/from Stand Sit to Stand: Max assist;+2 physical assistance         General transfer comment: assist to rise and stabilize.  Legs buckled and pt had hips forward flexed.  Cues and assistance to slide RLE forward    Balance Overall balance assessment: History of Falls;Needs assistance Sitting-balance support: Feet supported;Bilateral upper extremity supported Sitting balance-Leahy Scale: Poor                                      ADL Overall ADL's : Needs assistance/impaired                                       General ADL Comments: when OT arrived, pt was awake and stated she needed to go to the bathroom.  Had NT come in to assist.  Moved slowly and pt needed to be aroused several times when sitting up.  Pt stood once with assistance but did not stand erect and legs were buckling.  Did not perform transfer as it would not be safe at this time.  Pt was able to wash face with set up.  She needs increased assistance for UB adls due to level of arousal/dozing--mod A for UB adls and total A +2 for LB adls     Vision     Perception     Praxis      Pertinent Vitals/Pain Pain Assessment: Faces Pain Score: 10-Worst pain ever Faces Pain Scale: Hurts worst Pain Location: R hip with movement Pain Intervention(s): Limited activity within patient's tolerance;Monitored during session;Premedicated before session;Repositioned     Hand Dominance     Extremity/Trunk Assessment Upper Extremity Assessment Upper Extremity Assessment: Generalized weakness           Communication Communication Communication:  (slurring words)   Cognition  Arousal/Alertness:  (initially awake but dozing during evaluation) Behavior During Therapy: WFL for tasks assessed/performed Overall Cognitive Status: Difficult to assess                     General Comments       Exercises       Shoulder Instructions      Home Living Family/patient expects to be discharged to:: Private residence Living Arrangements: Alone                               Additional Comments: from [place].  Plan is for rehab then to son's house until she can return to [place] (per chart)      Prior Functioning/Environment Level of Independence: Independent with assistive device(s)        Comments: uses walker at baseline    OT Diagnosis: Acute pain;Generalized weakness   OT Problem List: Decreased  strength;Decreased activity tolerance;Impaired balance (sitting and/or standing);Pain;Decreased knowledge of use of DME or AE   OT Treatment/Interventions: Self-care/ADL training;DME and/or AE instruction;Patient/family education;Balance training;Therapeutic activities    OT Goals(Current goals can be found in the care plan section) Acute Rehab OT Goals Patient Stated Goal: none stated; wanted to use bathroom OT Goal Formulation: With patient Time For Goal Achievement: 03/01/16 Potential to Achieve Goals: Fair ADL Goals Pt Will Perform Lower Body Bathing: with mod assist;sit to/from stand;with adaptive equipment (+2) Pt Will Transfer to Toilet: with mod assist;with +2 assist;stand pivot transfer;bedside commode;with transfer board Pt Will Perform Toileting - Clothing Manipulation and hygiene: with max assist;sitting/lateral leans Additional ADL Goal #1: pt will sit eob with min guard and perform UB adls  OT Frequency: Min 2X/week   Barriers to D/C:            Co-evaluation              End of Session    Activity Tolerance: Patient limited by lethargy;Patient limited by pain Patient left: in bed;with call bell/phone within reach;with bed alarm set   Time: [phone] OT Time Calculation (min): 32 min Charges:  OT General Charges $OT Visit: 1 Procedure OT Evaluation $OT Eval Low Complexity: 1 Procedure OT Treatments $Therapeutic Activity: 8-22 mins G-Codes:    Alexis Wang 2016-03-12, 9:39 AM  Marica Otter, OTR/L [phone] Mar 12, 2016

## 2016-02-16 NOTE — Progress Notes (Signed)
Patient remains quite lethargic. Slow in her movements. Unable to drink from a cup without this nurse guiding the cup to her mouth. Complaining of "lots of pain". Administered oxycodone 5mg  at this time.

## 2016-02-16 NOTE — Evaluation (Signed)
Physical Therapy Evaluation Patient Details Name: Alexis Wang MRN: [id number] DOB: 1951/10/08 Today's Date: 02/16/2016   History of Present Illness  pt was visiting from [place] and sustained a fall resulting in R intertrochanteric fx. She is s/p IM nail.  PMH significant for ETOH and DM  Clinical Impression  Pt admitted as above and presenting with decreased R LE strength/ROM, generalized weakness, post op pain, and balance deficits limiting functional mobility.  Pt would benefit from follow up rehab at SNF level to maximize IND and safety.    Follow Up Recommendations SNF    Equipment Recommendations  None recommended by PT    Recommendations for Other Services OT consult     Precautions / Restrictions Precautions Precautions: Fall Restrictions Weight Bearing Restrictions: No RLE Weight Bearing: Weight bearing as tolerated      Mobility  Bed Mobility Overal bed mobility: +2 for physical assistance;Needs Assistance Bed Mobility: Supine to Sit;Sit to Supine     Supine to sit: Max assist;+2 for physical assistance Sit to supine: Max assist;+2 for physical assistance   General bed mobility comments: extra time, cues for sequence; assisted to/from EOB with pad  Transfers Overall transfer level: Needs assistance Equipment used: Rolling walker (2 wheeled) Transfers: Sit to/from Stand Sit to Stand: Max assist;+2 physical assistance         General transfer comment: Cues for LE management and use of UES to self assist.  Physical assist to bring wt up and fwd and to balance in standing.  Pt able to achieve fully erect standing with RW on second attempt but tolerating min WB on R LE and unable to initiate step with L LE.  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[employer] (Stroke Patients Only)       Balance Overall balance assessment: Needs assistance;History of Falls Sitting-balance  support: Feet supported;No upper extremity supported Sitting balance-Leahy Scale: Fair     Standing balance support: Bilateral upper extremity supported Standing balance-Leahy Scale: Poor                               Pertinent Vitals/Pain Pain Assessment: Faces Faces Pain Scale: Hurts whole lot Pain Location: R hip with movement Pain Intervention(s): Limited activity within patient's tolerance;Monitored during session;Premedicated before session;Ice applied (Pain meds ltd by pt level of sedation this am)    Home Living Family/patient expects to be discharged to:: Private residence Living Arrangements: Alone               Additional Comments: from [place].  Plan is for rehab then to son's house until she can return to [place] (per chart)    Prior Function Level of Independence: Independent with assistive device(s)         Comments: uses walker at baseline - since pelvic fx in 2014     Hand Dominance        Extremity/Trunk Assessment   Upper Extremity Assessment: Generalized weakness           Lower Extremity Assessment: Generalized weakness;RLE deficits/detail RLE Deficits / Details: Strength at hip 2-/5 with AAROM at hip to 70 flex and 10 abd    Cervical / Trunk Assessment: Kyphotic  Communication   Communication: HOH (mumbling)  Cognition Arousal/Alertness: Awake/alert Behavior During Therapy: [facility] for  tasks assessed/performed Overall Cognitive Status: Impaired/Different from baseline Area of Impairment: Memory;Following commands       Following Commands: Follows one step commands inconsistently       General Comments: Slow processing    General Comments      Exercises General Exercises - Lower Extremity Ankle Circles/Pumps: AROM;Both;10 reps;Supine Heel Slides: AAROM;Right;5 reps;Supine Hip ABduction/ADduction: AAROM;Right;5 reps;Supine      Assessment/Plan    PT Assessment Patient needs continued PT services  PT Diagnosis  Difficulty walking;Acute pain;Generalized weakness   PT Problem List Decreased strength;Decreased range of motion;Decreased activity tolerance;Decreased mobility;Decreased knowledge of use of DME;Pain;Decreased safety awareness;Decreased cognition;Decreased balance  PT Treatment Interventions DME instruction;Gait training;Functional mobility training;Therapeutic activities;Therapeutic exercise;Balance training;Patient/family education   PT Goals (Current goals can be found in the Care Plan section) Acute Rehab PT Goals Patient Stated Goal: Home PT Goal Formulation: With patient Time For Goal Achievement: 02/26/16 Potential to Achieve Goals: Fair    Frequency Min 3X/week   Barriers to discharge        Co-evaluation               End of Session Equipment Utilized During Treatment: Gait belt Activity Tolerance: Patient limited by fatigue;Patient limited by pain Patient left: in bed;with call bell/phone within reach;with family/visitor present Nurse Communication: Mobility status         Time: [id number] PT Time Calculation (min) (ACUTE ONLY): 33 min   Charges:   PT Evaluation $PT Eval Low Complexity: 1 Procedure PT Treatments $Therapeutic Activity: 8-22 mins   PT G Codes:        Isaic Syler 02/16/2016, 5:18 PM

## 2016-02-16 NOTE — Progress Notes (Signed)
PT Cancellation Note  Patient Details Name: Alexis EarlDiane Wang MRN: [id number] DOB: Jan 06, 1951   Cancelled Treatment:     PT deferred this am on advice of nursing and OT.  Pt lethargic and very ltd in ability to participate - ? MEDS.  Will follow.   Denver Harder 02/16/2016, 1:04 PM

## 2016-02-16 NOTE — Op Note (Signed)
NAMRoland Earl:  Alexis Wang, Alexis Wang                ACCOUNT NO.:  [id number]  MEDICAL RECORD NO.:  [id number]  LOCATION:  1609                         [facility]  PHYSICIAN:  Mark C. Ophelia CharterYates, M.D.    DATE OF BIRTH:  07-04-1951  DATE OF PROCEDURE:  02/15/2016 DATE OF DISCHARGE:                              OPERATIVE REPORT   PREOPERATIVE DIAGNOSIS:  Right intertrochanteric fracture.  POSTOPERATIVE DIAGNOSIS:  Right intertrochanteric fracture.  PROCEDURE:  Biomet right trochanteric nail, long.  11 x 380 nail with 100-mm lag screw.  42-mm distal femur, interlock screw.  SURGEON:  Mark C. Ophelia CharterYates, M.D.  ANESTHESIA:  General plus Marcaine local.  INDICATIONS:  A 65 year old female fell with intertrochanteric fracture. She has had past history of alcohol consumption, two beers in the morning, drinks of vodka during the day and evening.  DESCRIPTION OF PROCEDURE:  After informed consent, she was taken to the operating room.  Past history of penicillin rash when she was very young.  She had Ancef 2 g prophylaxis, which she tolerated well.  Hip was prepped with DuraPrep [facility] C-arm confirmation.  She was in near anatomic position and the fracture line was not visualized on the AP.  On the lateral, there might have been 1-mm displacement.  After prepping and standard draping, large shower curtain drape was applied.  Time-out procedure.  Incision was made proximal to troch.  Gluteus medius was split.  Steinmann pin was placed to the tip of the trochanter, confirmed with C-arm, drilled, overreamed and guidepin was placed down to the knee, measured at 410, but the tip of the guidepin was at the inferior pole of the patella, 380 was selected.  Rod was opened and inserted.  Proximal interlock was placed after guidepin was placed up into the head, low on AP and center- center on lateral C-arm.  Measured, rod inserted, locked down at the top without  compression since she was anatomic.  While the C-arm was being lined up for the distal interlock screw in the supracondylar region, the proximal incision was closed with #1 Vicryl suture, 2-0 Vicryl in subcutaneous tissue, skin staple closure.  Perfect circle was made for the interlock.  Stab incision was made.  Hole was drilled, checked under C-arm, measurement 40.  A 40-mm was placed, turned out, it was slightly short, was just barely coming to the cortex and it was removed.  A 42-mm screw was inserted, which came through the opposite cortex with the screw catching in for stability, holding length and rotation.  The proximal guide had been removed after the small screwdriver was used to lock down the lag screw.  Irrigation with saline solution and wound was closed.  Marcaine infiltrated into the incision without epinephrine and then postop dressings, Aquacel.  The patient tolerated the procedure well, transferred to the recovery room in stable condition after extubation.     Mark C. Ophelia CharterYates, M.D.     MCY/MEDQ  D:  02/15/2016  T:  02/16/2016  Job:  [id number]

## 2016-02-16 NOTE — Progress Notes (Addendum)
[facility]                                                                              Patient Demographics  Alexis Wang, is a 65 y.o. female, DOB - 1950-11-26, ZOX:[id number]  Admit date - 02/14/2016   Admitting Physician Maryruth Bun Rama, MD  Outpatient Primary MD for the patient is No primary care provider on file.  LOS - 2   Chief Complaint  Patient presents with  . Hip Pain      Interim history  65 year old with history of diabetes, alcohol dependence, tobacco abuse, hypertension presenting to the emergency department after mechanical fall. Patient does use an walker for ambulation at baseline. Patient noted to have right intertrochanteric fracture of the femur. Orthopedic consulted, surgery occurred on 02/15/2016. PT OT pending, likely need SNF.  Assessment & Plan   Right Intertrochanteric fracture of femur (HCC)Secondary to mechanical fall -Orthopedic surgery consulted appreciated, patient had surgery on 328 -Pending PT and OT -Will likely need SNF -Continue pain control  Diet controlled diabetes/diabetes mellitus without complication -A1C 4.8 -continue SSI while inpatient; no meal coverage and no long acting  -ok to continue diet management at discharge -CBG's in 79-120  Alcohol dependency (HCC) -Patient placed on CIWA and was receiving Ativan.   -No DTs -CIWA discontinued as patient was very somnolent this morning. -Continue multivitamin, thiamine and folic acid.  Hyponatremia -Suspect this is secondary to beer drinkers potomania. -Hydrate and monitor NA trend, sodium 130 today -Continue to monitor BMP  Bilateral hip osteoarthritis -Vit D 25hydroxy: 41.6  Tobacco abuse -cessation counseling reinforced -patient endorses she has quit and declined nicotine patch   Severe protein calorie malnutrition  -will follow nutritional service recommendations  Anemia secondary to acute blood loss/surgery -Hemoglobin currently 9.1 -Continue to monitor  CBC -Transfuse if below <7  Code Status: Full  Family Communication: None at bedside  Disposition Plan: Admitted, pending PT/OT eval.  Likely needs SNF  Time Spent in minutes   30 minutes  Procedures  IM nail femoral, right  Consults   Orthopedic surgery  DVT Prophylaxis  Lovenox  Lab Results  Component Value Date   PLT 189 02/16/2016    Medications  Scheduled Meds: . calcium-vitamin D  1 tablet Oral Daily  . cloNIDine  0.1 mg Oral BID  . enoxaparin (LOVENOX) injection  40 mg Subcutaneous Q24H  . folic acid  1 mg Oral Daily  . insulin aspart  0-5 Units Subcutaneous QHS  . insulin aspart  0-9 Units Subcutaneous TID WC  . prenatal multivitamin  1 tablet Oral Q1200  . senna  1 tablet Oral BID  . thiamine  100 mg Oral Daily   Or  . thiamine  100 mg Intravenous Daily   Continuous Infusions: . sodium chloride 75 mL/hr at 02/15/16 1337   PRN Meds:.acetaminophen **OR** acetaminophen, HYDROcodone-acetaminophen, menthol-cetylpyridinium **OR** phenol, metoCLOPramide **OR** metoCLOPramide (REGLAN) injection, ondansetron **OR** ondansetron (ZOFRAN) IV, oxyCODONE, polyethylene glycol  Antibiotics    Anti-infectives    Start     Dose/Rate Route Frequency Ordered Stop   02/15/16 1800  ceFAZolin (ANCEF) IVPB 2 g/50 mL premix     2 g 100 mL/hr over 30  Minutes Intravenous  Once 02/15/16 1749 02/15/16 1745      Subjective:   Alexis Wang seen and examined today.  Patient denies pain this morning. Does state she is very sleepy. Denies chest pain, short of breath, abdominal pain.  Objective:   Filed Vitals:   02/16/16 0301 02/16/16 0540 02/16/16 0946 02/16/16 1421  BP: 115/74 134/74 142/69 133/74  Pulse: 98 93 103 100  Temp: 98.1 F (36.7 C) 98.1 F (36.7 C) 98.1 F (36.7 C) 98.4 F (36.9 C)  TempSrc: Axillary Oral Oral Oral  Resp: Height:      Weight:      SpO2: 98% 99% 98% 100%    Wt Readings from Last 3 Encounters:  02/14/16 47.628 kg (105 lb)      Intake/Output Summary (Last 24 hours) at 02/16/16 1539 Last data filed at 02/16/16 1410  Gross per 24 hour  Intake  592.5 ml  Output   1350 ml  Net -757.5 ml    Exam  General: Well developed, thin, chronically ill appearing, NAD, somnolent  HEENT: NCAT, mucous membranes moist.   Cardiovascular: S1 S2 auscultated, no rubs, murmurs or gallops. Regular rate and rhythm.  Respiratory: Clear to auscultation bilaterally   Abdomen: Soft, nontender, nondistended, + bowel sounds  Extremities: warm dry without cyanosis clubbing or edema, Dressing on RLE  Neuro: AAOx3, somnolent, nonfocal  Data Review   Micro Results Recent Results (from the past 240 hour(s))  Surgical pcr screen     Status: None   Collection Time: 02/14/16  1:15 PM  Result Value Ref Range Status   MRSA, PCR NEGATIVE NEGATIVE Final   Staphylococcus aureus NEGATIVE NEGATIVE Final    Comment:        The Xpert SA Assay (FDA approved for NASAL specimens in patients over 85 years of age), is one component of a comprehensive surveillance program.  Test performance has been validated by [facility] for patients greater than or equal to 82 year old. It is not intended to diagnose infection nor to guide or monitor treatment.     Radiology Reports Dg Chest 1 View  02/14/2016  CLINICAL DATA:  Larey Seat in living room tonight.  RIGHT hip pain. EXAM: CHEST 1 VIEW COMPARISON:  None. FINDINGS: The heart size and mediastinal contours are within normal limits. Mild calcific atherosclerosis of the descending aorta. Both lungs are clear. The visualized skeletal structures are unremarkable. IMPRESSION: No acute cardiopulmonary process. Electronically Signed   By: Awilda Metro M.D.   On: 02/14/2016 06:39   Dg C-arm 1-60 Min-no Report  02/15/2016  CLINICAL DATA: hip C-ARM 1-60 MINUTES Fluoroscopy was utilized by the requesting physician.  No radiographic interpretation.   Dg Hip Unilat With Pelvis 2-3 Views  Right  02/15/2016  CLINICAL DATA:  Intramedullary nail right femur for intertrochanteric fracture EXAM: DG C-ARM 1-60 MIN-NO REPORT; DG HIP (WITH OR WITHOUT PELVIS) 2-3V RIGHT FLUOROSCOPY TIME:  Fluoroscopy Time (in minutes and seconds): 0 min 39 seconds Number of Acquired Images:  7 COMPARISON:  02/14/16 FINDINGS: IM nail in right femur in anticipated position. IMPRESSION: Fluoroscopy for IM nail placement Electronically Signed   By: Esperanza Heir M.D.   On: 02/15/2016 19:26   Dg Hip Unilat With Pelvis 2-3 Views Right  02/14/2016  CLINICAL DATA:  Larey Seat in living room tonight. RIGHT hip pain. EXAM: DG HIP (WITH OR WITHOUT PELVIS) 2-3V RIGHT COMPARISON:  None. FINDINGS: Acute RIGHT femur intertrochanteric fracture with mild impaction, in general  alignment. Age indeterminate LEFT superior pubic ramus nondisplaced fracture. No dislocation. Osteopenia without destructive bony lesions. Severe degenerative change of the LEFT hip with mild femoral head collapse. Soft tissue planes are nonsuspicious. IMPRESSION: Acute mildly impacted RIGHT femur intertrochanteric fracture. No dislocation. 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 Age indeterminate LEFT superior pubic ramus nondisplaced fracture. Electronically Signed   By: Awilda Metroourtnay  Bloomer M.D.   On: 02/14/2016 06:41    CBC  Recent Labs Lab 02/14/16 0515 02/15/16 2207 02/16/16 0405  WBC 6.5 6.9 6.6  HGB 13.5 10.1* 9.1*  HCT 36.5 28.4* 25.3*  PLT 365 188 189  MCV 94.6 97.3 96.6  MCH 35.0* 34.6* 34.7*  MCHC 37.0* 35.6 36.0  RDW 13.5 13.6 13.3  LYMPHSABS 0.7  --   --   MONOABS 0.6  --   --   EOSABS 0.1  --   --   BASOSABS 0.0  --   --     Chemistries   Recent Labs Lab 02/14/16 0515 02/15/16 0421 02/15/16 2207 02/16/16 0405  NA 127* 128*  --  130*  K 3.9 4.0  --  3.8  CL 90* 95*  --  99*  CO2 20* 21*  --  22  GLUCOSE 71 120*  --  215*  BUN 6 6  --  <5*  CREATININE 0.60 0.50 0.54 0.51  CALCIUM  10.1 9.4  --  9.3  AST 48*  --   --   --   ALT 25  --   --   --   ALKPHOS 136*  --   --   --   BILITOT 0.8  --   --   --    ------------------------------------------------------------------------------------------------------------------ estimated creatinine clearance is 54.1 mL/min (by C-G formula based on Cr of 0.51). ------------------------------------------------------------------------------------------------------------------  Recent Labs  02/14/16 0510  HGBA1C 4.8   ------------------------------------------------------------------------------------------------------------------ No results for input(s): CHOL, HDL, LDLCALC, TRIG, CHOLHDL, LDLDIRECT in the last 72 hours. ------------------------------------------------------------------------------------------------------------------ No results for input(s): TSH, T4TOTAL, T3FREE, THYROIDAB in the last 72 hours.  Invalid input(s): FREET3 ------------------------------------------------------------------------------------------------------------------ No results for input(s): VITAMINB12, FOLATE, FERRITIN, TIBC, IRON, RETICCTPCT in the last 72 hours.  Coagulation profile  Recent Labs Lab 02/14/16 0515  INR 0.97    No results for input(s): DDIMER in the last 72 hours.  Cardiac Enzymes No results for input(s): CKMB, TROPONINI, MYOGLOBIN in the last 168 hours.  Invalid input(s): CK ------------------------------------------------------------------------------------------------------------------ Invalid input(s): POCBNP    Jourdon Zimmerle D.O. on 02/16/2016 at 3:39 PM  Between 7am to 7pm - Pager - [phone]  After 7pm go to [email or web address] - password [email or web address]  And look for the night coverage person covering for me after hours  [facility] Office  [phone]

## 2016-02-16 NOTE — Care Management Note (Signed)
Case Management Note  Patient Details  Name: Roland EarlDiane Fessel MRN: [id number] Date of Birth: [birth year]  Subjective/Objective:                  INTRAMEDULLARY (IM) NAIL FEMORAL (Right) Action/Plan: Discharge planning Expected Discharge Date:                  Expected Discharge Plan:  Skilled Nursing Facility  In-House Referral:     Discharge planning Services  CM Consult  Post Acute Care Choice:    Choice offered to:     DME Arranged:    DME Agency:     HH Arranged:    HH Agency:     Status of Service:  Completed, signed off  Medicare Important Message Given:    Date Medicare IM Given:    Medicare IM give by:    Date Additional Medicare IM Given:    Additional Medicare Important Message give by:     If discussed at Long Length of Stay Meetings, dates discussed:    Additional Comments: CM notes pt to go to SNF; CSW arranging.  No other CM needs were communicated. Yves DillJeffries, Anaya Bovee Christine, RN 02/16/2016, 10:25 AM

## 2016-02-16 NOTE — Anesthesia Postprocedure Evaluation (Signed)
Anesthesia Post Note  Patient: Roland EarlDiane Turney  Procedure(s) Performed: Procedure(s) (LRB): INTRAMEDULLARY (IM) NAIL FEMORAL (Right)  Patient location during evaluation: PACU Anesthesia Type: General Level of consciousness: awake and alert Pain management: pain level controlled Vital Signs Assessment: post-procedure vital signs reviewed and stable Respiratory status: spontaneous breathing, nonlabored ventilation, respiratory function stable and patient connected to nasal cannula oxygen Cardiovascular status: blood pressure returned to baseline and stable Postop Assessment: no signs of nausea or vomiting Anesthetic complications: no    Last Vitals:  Filed Vitals:   02/16/16 0946 02/16/16 1421  BP: 142/69 133/74  Pulse: 103 100  Temp: 36.7 C 36.9 C  Resp: 16 15    Last Pain:  Filed Vitals:   02/16/16 1518  PainSc: 4                  Thersea Manfredonia L

## 2016-02-17 DIAGNOSIS — E876 Hypokalemia: Secondary | ICD-10-CM

## 2016-02-17 DIAGNOSIS — D62 Acute posthemorrhagic anemia: Secondary | ICD-10-CM

## 2016-02-17 LAB — BASIC METABOLIC PANEL
ANION GAP: 8 (ref 5–15)
BUN: 5 mg/dL — AB (ref 6–20)
CO2: 23 mmol/L (ref 22–32)
Calcium: 9 mg/dL (ref 8.9–10.3)
Chloride: 101 mmol/L (ref 101–111)
Creatinine, Ser: 0.45 mg/dL (ref 0.44–1.00)
Glucose, Bld: 111 mg/dL — ABNORMAL HIGH (ref 65–99)
POTASSIUM: 3.4 mmol/L — AB (ref 3.5–5.1)
SODIUM: 132 mmol/L — AB (ref 135–145)

## 2016-02-17 LAB — CBC
HCT: 20.7 % — ABNORMAL LOW (ref 36.0–46.0)
Hemoglobin: 7.5 g/dL — ABNORMAL LOW (ref 12.0–15.0)
MCH: 35 pg — ABNORMAL HIGH (ref 26.0–34.0)
MCHC: 36.2 g/dL — ABNORMAL HIGH (ref 30.0–36.0)
MCV: 96.7 fL (ref 78.0–100.0)
PLATELETS: 175 10*3/uL (ref 150–400)
RBC: 2.14 MIL/uL — AB (ref 3.87–5.11)
RDW: 13.5 % (ref 11.5–15.5)
WBC: 4.9 10*3/uL (ref 4.0–10.5)

## 2016-02-17 LAB — PREPARE RBC (CROSSMATCH)

## 2016-02-17 MED ORDER — POTASSIUM CHLORIDE CRYS ER 20 MEQ PO TBCR
40.0000 meq | EXTENDED_RELEASE_TABLET | Freq: Once | ORAL | Status: AC
  Administered 2016-02-18: 40 meq via ORAL
  Filled 2016-02-17: qty 2

## 2016-02-17 MED ORDER — SODIUM CHLORIDE 0.9 % IV SOLN: Freq: Once | INTRAVENOUS | Status: DC

## 2016-02-17 MED ORDER — FUROSEMIDE 10 MG/ML IJ SOLN
20.0000 mg | Freq: Once | INTRAMUSCULAR | Status: AC
  Administered 2016-02-17: 20 mg via INTRAVENOUS
  Filled 2016-02-17: qty 2

## 2016-02-17 NOTE — Progress Notes (Signed)
Patient/son will decide if patient wants blood transfusion in the AM (02/18/16.)  Left message for Zonia KiefJames  Owens, PA via voicemail.

## 2016-02-17 NOTE — Progress Notes (Signed)
Nutrition Follow-up  DOCUMENTATION CODES:   Severe malnutrition in context of chronic illness, Underweight  INTERVENTION:   -Provide daily snack -Encourage PO intake -RD to continue to monitor  NUTRITION DIAGNOSIS:   Increased nutrient needs related to other (see comment) (hip fracture, ETOH abuse) as evidenced by estimated needs.  Ongoing.  GOAL:   Patient will meet greater than or equal to 90% of their needs  Progressing.  MONITOR:   PO intake, Labs, Weight trends, I & O's  ASSESSMENT:  65 y.o. female with a PMH of diet controlled diabetes, alcohol dependence, tobacco abuse and hypertension who is visiting her son from out of town, and presented to the ED this morning after a mechanical fall. Patient ambulates with a walker and reports that she got her feet tangled up and fell, landing on her right side. She had severe pain in the right hip and was unable to ambulate or bear weight after the fall. Movement exacerbates the pain. Morphine, given in the ED, has not appreciably helped her pain. Denies any loss of consciousness. Patient admits to drinking 2 beers in the morning, and 2-3 mixed drinks in the evening.  3/28: s/p Procedure(s) (LRB): INTRAMEDULLARY (IM) NAIL FEMORAL (Right)  Patient's diet advanced to regular diet. PO intake: 10-40%. Yesterday consumed a few bites of chicken soup and ice cream. Pt to receive daily snack of yogurt with fruit. RD to continue to monitor PO intake.  Medications: OSCAL w/ Vit D daily, folic acid tablet daily, Multivitamin with minerals daily, thiamine tablet daily Labs reviewed: CBGs: 115-147 Low Na, K  Diet Order:  Diet regular Room service appropriate?: Yes; Fluid consistency:: Thin  Skin:  Wound (see comment) (hand wound, hip incision)  Last BM:  3/26  Height:   Ht Readings from Last 1 Encounters:  02/14/16 5\' 5"  (1.651 m)    Weight:   Wt Readings from Last 1 Encounters:  02/14/16 105 lb (47.628 kg)    Ideal Body  Weight:  56.8 kg  BMI:  Body mass index is 17.47 kg/(m^2).  Estimated Nutritional Needs:   Kcal:  1500-1700  Protein:  75-85g  Fluid:  1.7L/day  EDUCATION NEEDS:   No education needs identified at this time  Alexis FrancoLindsey Sharon Rubis, MS, RD, LDN Pager: [phone] After Hours Pager: [phone]

## 2016-02-17 NOTE — Progress Notes (Signed)
Subjective: C/o right hip pain.  Has stood by bedside with PT.  No ambulation.  Son in room.  Looks tired.     Objective: Vital signs in last 24 hours: Temp:  [98.1 F (36.7 C)-98.7 F (37.1 C)] 98.7 F (37.1 C) (03/30 0537) Pulse Rate:  [93-103] 93 (03/30 0537) Resp:  [15-16] 15 (03/30 0537) BP: (126-142)/(69-77) 126/72 mmHg (03/30 0537) SpO2:  [97 %-100 %] 97 % (03/30 0537)  Intake/Output from previous day: 03/29 0701 - 03/30 0700 In: 420 [P.O.:180; I.V.:240] Out: 725 [Urine:725] Intake/Output this shift:     Recent Labs  02/15/16 2207 02/16/16 0405 02/17/16 0410  HGB 10.1* 9.1* 7.5*    Recent Labs  02/16/16 0405 02/17/16 0410  WBC 6.6 4.9  RBC 2.62* 2.14*  HCT 25.3* 20.7*  PLT 189 175    Recent Labs  02/16/16 0405 02/17/16 0410  NA 130* 132*  K 3.8 3.4*  CL 99* 101  CO2 22 23  BUN <5* 5*  CREATININE 0.51 0.45  GLUCOSE 215* 111*  CALCIUM 9.3 9.0   No results for input(s): LABPT, INR in the last 72 hours.  Exam: drowsy but does answer questions.  Dressings intact.  bilat calves nontender.  NVI.  No motor deficits.    Assessment/Plan: End stage djd left hip and s/p orif right hip ABLA.  Admission hemoglobin 14 February 2016 was 13.5.   Awaiting d/c planning.  Needs snf placement before going home to [place].  Son present and he agrees with this. Question symptomatic ABLA with hgb 7.5 today.  Medicine had mentioned transfusing if <7.  RN will notify them.  Continue to mobilize if medically stable and safe.     Nivek Powley M 02/17/2016, 8:56 AM

## 2016-02-17 NOTE — Progress Notes (Signed)
Patient and son are still undecided as to whether or not patient will be receiving a blood transfusion. Zonia KiefJames Owens, PA notified, and will let him know when patient/family make a decision.

## 2016-02-17 NOTE — Progress Notes (Signed)
02/17/16 0600 Nursing Physician on call paged reg hemoglobin 7.5 this morning. No response yet.

## 2016-02-17 NOTE — Progress Notes (Signed)
Occupational Therapy Treatment Patient Details Name: Alexis EarlDiane Wang MRN: [id number] DOB: September 25, 1951 Today's Date: 02/17/2016    History of present illness pt was visiting from [place] and sustained a fall resulting in R intertrochanteric fx. She is s/p IM nail.  PMH significant for ETOH and DM   OT comments  Pt much more alert and improved participation/transfer.  Pt needs extra time and tries to do as much as she can for herself.  Introduced AE but did not use this session  Follow Up Recommendations  SNF    Equipment Recommendations  3 in 1 bedside comode    Recommendations for Other Services      Precautions / Restrictions Precautions Precautions: Fall Restrictions Weight Bearing Restrictions: No RLE Weight Bearing: Weight bearing as tolerated       Mobility Bed Mobility Overal bed mobility: +2 for physical assistance;Needs Assistance Bed Mobility: Sit to Supine       Sit to supine: Min assist;+2 for physical assistance;+2 for safety/equipment   General bed mobility comments: Increased time and frequenct breaks to complete task.  Cues for sequence and use of L LE to self assist  Transfers Overall transfer level: Needs assistance Equipment used: Rolling walker (2 wheeled) Transfers: Sit to/from Stand Sit to Stand: Mod assist;+2 physical assistance;+2 safety/equipment         General transfer comment: cues for UE/LE placement.  Assist to power up, shift forward and stabilize    Balance Overall balance assessment: Needs assistance Sitting-balance support: No upper extremity supported Sitting balance-Leahy Scale: Fair     Standing balance support: Bilateral upper extremity supported Standing balance-Leahy Scale: Poor                     ADL Overall ADL's : Needs assistance/impaired                                       General ADL Comments: pt was very uncomfortable sitting in chair. After transferring her back to bed, showed her AE, but  she did not practice with it.  Will practice on next visit      Vision                     Perception     Praxis      Cognition   Behavior During Therapy: [facility]FL for tasks assessed/performed Overall Cognitive Status: Within Functional Limits for tasks assessed          Following Commands: Follows one step commands consistently            Extremity/Trunk Assessment               Exercises     Shoulder Instructions       General Comments      Pertinent Vitals/ Pain       Pain Assessment: Faces Faces Pain Scale: Hurts even more Pain Location: R hip with movement Pain Intervention(s): Limited activity within patient's tolerance;Monitored during session;Premedicated before session;Repositioned  Home Living                                          Prior Functioning/Environment              Frequency Min 2X/week     Progress Toward Goals  OT Goals(current goals can now be found in the care plan section)  Progress towards OT goals: Progressing toward goals  Acute Rehab OT Goals Patient Stated Goal: Home  Plan Discharge plan remains appropriate    Co-evaluation      Reason for Co-Treatment: For patient/therapist safety PT goals addressed during session: Mobility/safety with mobility OT goals addressed during session: ADL's and self-care      End of Session     Activity Tolerance Patient limited by pain   Patient Left in bed;with call bell/phone within reach;with bed alarm set;with family/visitor present   Nurse Communication          Time: 1610-9604 OT Time Calculation (min): 29 min  Charges: OT General Charges $OT Visit: 1 Procedure OT Treatments $Therapeutic Activity: 8-22 mins  Alexis Wang 02/17/2016, 2:56 PM  Alexis Wang, OTR/L [phone] 02/17/2016

## 2016-02-17 NOTE — Progress Notes (Signed)
Physical Therapy Treatment Patient Details Name: Alexis EarlDiane Colburn MRN: [id number] DOB: 25-Jul-1951 Today's Date: 02/17/2016    History of Present Illness pt was visiting from [place] and sustained a fall resulting in R intertrochanteric fx. She is s/p IM nail.  PMH significant for ETOH and DM    PT Comments    Marked improvement in pt ability and motivation to participate.  Pt able to initiate several steps this pm.  Follow Up Recommendations  SNF     Equipment Recommendations  None recommended by PT    Recommendations for Other Services OT consult     Precautions / Restrictions Precautions Precautions: Fall Restrictions Weight Bearing Restrictions: No RLE Weight Bearing: Weight bearing as tolerated    Mobility  Bed Mobility Overal bed mobility: +2 for physical assistance;Needs Assistance Bed Mobility: Sit to Supine       Sit to supine: Min assist;+2 for physical assistance;+2 for safety/equipment   General bed mobility comments: Increased time and frequenct breaks to complete task.  Cues for sequence and use of L LE to self assist  Transfers Overall transfer level: Needs assistance Equipment used: Rolling walker (2 wheeled) Transfers: Sit to/from Stand Sit to Stand: Mod assist;+2 physical assistance;+2 safety/equipment         General transfer comment: Cues for LE management and use of UEs to self assist.  Assist to bring wt up and fwd and to balance in initial standing  Ambulation/Gait Ambulation/Gait assistance: Mod assist;+2 physical assistance;+2 safety/equipment Ambulation Distance (Feet): 2 Feet Assistive device: Rolling walker (2 wheeled) Gait Pattern/deviations: Step-to pattern;Decreased step length - right;Decreased step length - left;Shuffle;Trunk flexed Gait velocity: decr Gait velocity interpretation: Below normal speed for age/gender General Gait Details: Increased time but pt able to initiate step with both LEs   Stairs            Wheelchair  Mobility    Modified Rankin (Stroke Patients Only)       Balance Overall balance assessment: Needs assistance Sitting-balance support: No upper extremity supported;Feet supported Sitting balance-Leahy Scale: Fair     Standing balance support: Bilateral upper extremity supported Standing balance-Leahy Scale: Poor                      Cognition Arousal/Alertness: Awake/alert Behavior During Therapy: WFL for tasks assessed/performed Overall Cognitive Status: Within Functional Limits for tasks assessed         Following Commands: Follows one step commands consistently            Exercises      General Comments        Pertinent Vitals/Pain Pain Assessment: Faces Faces Pain Scale: Hurts even more Pain Location: R hip with activity Pain Intervention(s): Limited activity within patient's tolerance;Monitored during session;Premedicated before session    Home Living                      Prior Function            PT Goals (current goals can now be found in the care plan section) Acute Rehab PT Goals Patient Stated Goal: Home PT Goal Formulation: With patient Time For Goal Achievement: 02/26/16 Potential to Achieve Goals: Fair Progress towards PT goals: Progressing toward goals    Frequency  Min 3X/week    PT Plan Current plan remains appropriate    Co-evaluation PT/OT/SLP Co-Evaluation/Treatment: Yes Reason for Co-Treatment: For patient/therapist safety PT goals addressed during session: Mobility/safety with mobility OT goals addressed during session: ADL's  and self-care     End of Session Equipment Utilized During Treatment: Gait belt Activity Tolerance: Patient limited by fatigue;Patient limited by pain Patient left: in bed;with call bell/phone within reach;with bed alarm set;with family/visitor present     Time: 1315-1340 PT Time Calculation (min) (ACUTE ONLY): 25 min  Charges:  $Therapeutic Activity: 8-22 mins                     G Codes:      Caprice Wasko 02-28-2016, 1:54 PM

## 2016-02-17 NOTE — Progress Notes (Signed)
Patient refused blood glucose testing at this time. Patient educated again.

## 2016-02-17 NOTE — Progress Notes (Signed)
After Dr. Waymon AmatoHongalgi discussed blood transfusion with patient and her son, patient is still undecided as to whether or not she wants the transfusion. I spoke with Zonia KiefJames Owens, PA who will speak with patient and son when son returns to the unit.

## 2016-02-17 NOTE — Progress Notes (Signed)
Patient ID: Alexis EarlDiane John, female   DOB: Jun 11, 1951, 65 y.o.   MRN: [id number]  I did have a phone discussion with patients son again regarding transfusion.  He will relay to his mother the potential risks involved with a symptomatic ABLA.  Obviously they understand that we cannot force the transfusion.  All questions answered.

## 2016-02-17 NOTE — Progress Notes (Signed)
[facility]                                                                              Patient Demographics  Alexis Wang, is a 65 y.o. female, DOB - [birth year], ZOX:[id number]  Admit date - 02/14/2016   Admitting Physician Maryruth Bun Rama, MD  Outpatient Primary MD for the patient is No primary care provider on file.  LOS - 3   Chief Complaint  Patient presents with  . Hip Pain      Interim history  65 year old with history of diabetes, alcohol dependence, tobacco abuse, hypertension presenting to the emergency department after mechanical fall. Patient does use an walker for ambulation at baseline. Patient noted to have right intertrochanteric fracture of the femur. Orthopedic consulted, surgery occurred on 02/15/2016. SNF when medically stable.  Assessment & Plan   Right Intertrochanteric fracture of femur (HCC)Secondary to mechanical fall - Status post surgery/trochanteric nail on 3/29. Pain adequately controlled.  Postoperative acute blood loss anemia - Hemoglobin has dropped to 7.5 on 3/30 from 13 on 3/27. She does have generalized weakness. Advised transfusing the unit of PRBC for symptomatic anemia. She had questions regarding risks of blood transfusion. I went over details of risks and benefits of blood transfusion in detail with her in the presence of her son. They were contemplating next step and had not made a decision at that time. - Follow CBC in a.m.  Diet controlled diabetes/diabetes mellitus without complication -A1C 4.8 -continue SSI while inpatient; no meal coverage and no long acting  -ok to continue diet management at discharge - Good inpatient control.  Alcohol dependency (HCC) -Patient placed on CIWA and was receiving Ativan.   -No DTs -CIWA discontinued as patient was very somnolent. -Continue multivitamin, thiamine and folic acid. - No overt withdrawal.  Hyponatremia -Suspect this is secondary to beer drinkers potomania. -  Stable.  Bilateral hip osteoarthritis -Vit D 25hydroxy: 41.6  Tobacco abuse -cessation counseling reinforced -patient endorses she has quit and declined nicotine patch   Severe protein calorie malnutrition  -will follow nutritional service recommendations   Code Status: Full  Family Communication: Discussed extensively with patient's son at bedside.  Disposition Plan: DC to SNF when medically stable.  Time Spent in minutes   30 minutes  Procedures  IM nail femoral, right  Consults   Orthopedic surgery  DVT Prophylaxis  Lovenox    Medications  Scheduled Meds: . sodium chloride   Intravenous Once  . calcium-vitamin D  1 tablet Oral Daily  . cloNIDine  0.1 mg Oral BID  . enoxaparin (LOVENOX) injection  40 mg Subcutaneous Q24H  . folic acid  1 mg Oral Daily  . insulin aspart  0-5 Units Subcutaneous QHS  . insulin aspart  0-9 Units Subcutaneous TID WC  . prenatal multivitamin  1 tablet Oral Q1200  . senna  1 tablet Oral BID  . thiamine  100 mg Oral Daily   Or  . thiamine  100 mg Intravenous Daily   Continuous Infusions: . sodium chloride 75 mL/hr at 02/15/16 1337   PRN Meds:.acetaminophen **OR** acetaminophen, HYDROcodone-acetaminophen, menthol-cetylpyridinium **OR** phenol, metoCLOPramide **OR** metoCLOPramide (REGLAN) injection, ondansetron **OR** ondansetron (ZOFRAN) IV, oxyCODONE, polyethylene  glycol  Antibiotics    Anti-infectives    Start     Dose/Rate Route Frequency Ordered Stop   02/15/16 1800  ceFAZolin (ANCEF) IVPB 2 g/50 mL premix     2 g 100 mL/hr over 30 Minutes Intravenous  Once 02/15/16 1749 02/15/16 1745      Subjective:   Had some pain at surgical site but tolerable. Felt weak and worn out but denied chest pain, dizziness, lightheadedness or dyspnea. Seemed slightly confused at times.  Objective:   Filed Vitals:   02/16/16 1421 02/16/16 2121 02/17/16 0537 02/17/16 1402  BP: 133/74 133/77 126/72 109/70  Pulse: 100 103 93 88  Temp:  98.4 F (36.9 C) 98.6 F (37 C) 98.7 F (37.1 C) 98.2 F (36.8 C)  TempSrc: Oral Oral Oral Oral  Resp: 15 15 15 16   Height:      Weight:      SpO2: 100% 98% 97% 100%    Wt Readings from Last 3 Encounters:  02/14/16 47.628 kg (105 lb)     Intake/Output Summary (Last 24 hours) at 02/17/16 1722 Last data filed at 02/17/16 1406  Gross per 24 hour  Intake    600 ml  Output    500 ml  Net    100 ml    Exam  General: Well developed, thin, chronically ill appearing, NAD, sitting comfortably in bed without distress.  HEENT: NCAT, mucous membranes moist.   Cardiovascular: S1 S2 auscultated, no rubs, murmurs or gallops. Regular rate and rhythm. No JVD or pedal edema.  Respiratory: Clear to auscultation bilaterally . No increased work of breathing.  Abdomen: Soft, nontender, nondistended, + bowel sounds  Extremities: warm dry without cyanosis clubbing or edema, Dressing on RLE, clean and dry.  Neuro: Alert and oriented. No focal deficits.  Data Review   Micro Results Recent Results (from the past 240 hour(s))  Surgical pcr screen     Status: None   Collection Time: 02/14/16  1:15 PM  Result Value Ref Range Status   MRSA, PCR NEGATIVE NEGATIVE Final   Staphylococcus aureus NEGATIVE NEGATIVE Final    Comment:        The Xpert SA Assay (FDA approved for NASAL specimens in patients over 63 years of age), is one component of a comprehensive surveillance program.  Test performance has been validated by [facility] for patients greater than or equal to 63 year old. It is not intended to diagnose infection nor to guide or monitor treatment.     Radiology Reports Dg Chest 1 View  02/14/2016  CLINICAL DATA:  Larey SeatFell in living room tonight.  RIGHT hip pain. EXAM: CHEST 1 VIEW COMPARISON:  None. FINDINGS: The heart size and mediastinal contours are within normal limits. Mild calcific atherosclerosis of the descending aorta. Both lungs are clear. The visualized skeletal  structures are unremarkable. IMPRESSION: No acute cardiopulmonary process. Electronically Signed   By: Awilda Metroourtnay  Bloomer M.D.   On: 02/14/2016 06:39   Dg C-arm 1-60 Min-no Report  02/15/2016  CLINICAL DATA: hip C-ARM 1-60 MINUTES Fluoroscopy was utilized by the requesting physician.  No radiographic interpretation.   Dg Hip Unilat With Pelvis 2-3 Views Right  02/15/2016  CLINICAL DATA:  Intramedullary nail right femur for intertrochanteric fracture EXAM: DG C-ARM 1-60 MIN-NO REPORT; DG HIP (WITH OR WITHOUT PELVIS) 2-3V RIGHT FLUOROSCOPY TIME:  Fluoroscopy Time (in minutes and seconds): 0 min 39 seconds Number of Acquired Images:  7 COMPARISON:  02/14/16 FINDINGS: IM nail in right femur in  anticipated position. IMPRESSION: Fluoroscopy for IM nail placement Electronically Signed   By: Esperanza Heir M.D.   On: 02/15/2016 19:26   Dg Hip Unilat With Pelvis 2-3 Views Right  02/14/2016  CLINICAL DATA:  Larey Seat in living room tonight. RIGHT hip pain. EXAM: DG HIP (WITH OR WITHOUT PELVIS) 2-3V RIGHT COMPARISON:  None. FINDINGS: Acute RIGHT femur intertrochanteric fracture with mild impaction, in general alignment. Age indeterminate LEFT superior pubic ramus nondisplaced fracture. No dislocation. Osteopenia without destructive bony lesions. Severe degenerative change of the LEFT hip with mild femoral head collapse. Soft tissue planes are nonsuspicious. IMPRESSION: Acute mildly impacted RIGHT femur intertrochanteric fracture. No dislocation. 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 Age indeterminate LEFT superior pubic ramus nondisplaced fracture. Electronically Signed   By: Awilda Metro M.D.   On: 02/14/2016 06:41    CBC  Recent Labs Lab 02/14/16 0515 02/15/16 2207 02/16/16 0405 02/17/16 0410  WBC 6.5 6.9 6.6 4.9  HGB 13.5 10.1* 9.1* 7.5*  HCT 36.5 28.4* 25.3* 20.7*  PLT 365 188 189 175  MCV 94.6 97.3 96.6 96.7  MCH 35.0* 34.6* 34.7* 35.0*  MCHC 37.0* 35.6  36.0 36.2*  RDW 13.5 13.6 13.3 13.5  LYMPHSABS 0.7  --   --   --   MONOABS 0.6  --   --   --   EOSABS 0.1  --   --   --   BASOSABS 0.0  --   --   --     Chemistries   Recent Labs Lab 02/14/16 0515 02/15/16 0421 02/15/16 2207 02/16/16 0405 02/17/16 0410  NA 127* 128*  --  130* 132*  K 3.9 4.0  --  3.8 3.4*  CL 90* 95*  --  99* 101  CO2 20* 21*  --  22 23  GLUCOSE 71 120*  --  215* 111*  BUN 6 6  --  <5* 5*  CREATININE 0.60 0.50 0.54 0.51 0.45  CALCIUM 10.1 9.4  --  9.3 9.0  AST 48*  --   --   --   --   ALT 25  --   --   --   --   ALKPHOS 136*  --   --   --   --   BILITOT 0.8  --   --   --   --    ------------------------------------------------------------------------------------------------------------------ estimated creatinine clearance is 54.1 mL/min (by C-G formula based on Cr of 0.45). ------------------------------------------------------------------------------------------------------------------ No results for input(s): HGBA1C in the last 72 hours. ------------------------------------------------------------------------------------------------------------------ No results for input(s): CHOL, HDL, LDLCALC, TRIG, CHOLHDL, LDLDIRECT in the last 72 hours. ------------------------------------------------------------------------------------------------------------------ No results for input(s): TSH, T4TOTAL, T3FREE, THYROIDAB in the last 72 hours.  Invalid input(s): FREET3 ------------------------------------------------------------------------------------------------------------------ No results for input(s): VITAMINB12, FOLATE, FERRITIN, TIBC, IRON, RETICCTPCT in the last 72 hours.  Coagulation profile  Recent Labs Lab 02/14/16 0515  INR 0.97    No results for input(s): DDIMER in the last 72 hours.  Cardiac Enzymes No results for input(s): CKMB, TROPONINI, MYOGLOBIN in the last 168 hours.  Invalid input(s):  CK ------------------------------------------------------------------------------------------------------------------ Invalid input(s): POCBNP    Moksha Dorgan, MD, FACP, FHM. [facility] Pager [phone]  If 7PM-7AM, please contact night-coverage [email or web address] Password [email or web address] 02/17/2016, 5:30 PM

## 2016-02-17 NOTE — Progress Notes (Signed)
Patient refused blood glucose check this AM. Patient educated.

## 2016-02-17 NOTE — Progress Notes (Signed)
Patient ID: Alexis EarlDiane Wang, female   DOB: [birth year], 65 y.o.   MRN: [id number]  I spoke with RN regarding transfusion of PRBC's for symptomatic ABLA.  I did put the order in for 2 units but RN states that after hospitalist spoke to patient she is now refusing transfusion.  I had long thorough discussion with patient and her son who was present this AM and explained the increased risk of fatigue, dizziness, falls, etc.  Geraldine ContrasDee RN will contact me when son is present.

## 2016-02-17 NOTE — Progress Notes (Signed)
Patient refused 17:00 blood glucose check.

## 2016-02-17 NOTE — Progress Notes (Addendum)
Physical Therapy Treatment Patient Details Name: Alexis Wang MRN: [id number] DOB: 1951-07-15 Today's Date: 02/17/2016    History of Present Illness pt was visiting from [place] and sustained a fall resulting in R intertrochanteric fx. She is s/p IM nail.  PMH significant for ETOH and DM    PT Comments    Pt more clear headed this am and attempting to self assist more than last visit but continues to require significant assist and increased time for all mobility tasks.  Orthostatic BPs taken this am: supine 126/67 with HR 79; sit 131/67 with HR101; and standing 149/68 with HR 113.  Follow Up Recommendations  SNF     Equipment Recommendations  None recommended by PT    Recommendations for Other Services OT consult     Precautions / Restrictions Precautions Precautions: Fall Restrictions Weight Bearing Restrictions: No RLE Weight Bearing: Weight bearing as tolerated    Mobility  Bed Mobility Overal bed mobility: +2 for physical assistance;Needs Assistance Bed Mobility: Supine to Sit     Supine to sit: HOB elevated;Mod assist;+2 for physical assistance;+2 for safety/equipment     General bed mobility comments: extra time, cues for sequence; assisted to/from EOB with pad  Transfers Overall transfer level: Needs assistance Equipment used: Rolling walker (2 wheeled) Transfers: Sit to/from Stand Sit to Stand: Max assist;+2 physical assistance         General transfer comment: Cues for LE management and use of UES to self assist.  Physical assist to bring wt up and fwd and to balance in standing.  Pt able to achieve fully erect standing with RW on second attempt but tolerating min WB on R LE and unable to initiate step with L LE.  Pt performed stand pvt on L LE with RW and +2 assist with chair pulled in behind her  Ambulation/Gait             General Gait Details: Stand pvt to chair only 2* ltd tolerance   Stairs            Wheelchair Mobility    Modified  Rankin (Stroke Patients Only)       Balance                                    Cognition Arousal/Alertness: Awake/alert Behavior During Therapy: WFL for tasks assessed/performed Overall Cognitive Status: Impaired/Different from baseline Area of Impairment: Memory;Following commands       Following Commands: Follows one step commands consistently       General Comments: Slow processing    Exercises      General Comments        Pertinent Vitals/Pain Pain Assessment: Faces Faces Pain Scale: Hurts whole lot Pain Location: R hip with activity Pain Intervention(s): Limited activity within patient's tolerance;Monitored during session;Premedicated before session (Pt declines ice pack)    Home Living                      Prior Function            PT Goals (current goals can now be found in the care plan section) Acute Rehab PT Goals Patient Stated Goal: Home PT Goal Formulation: With patient Time For Goal Achievement: 02/26/16 Potential to Achieve Goals: Fair Progress towards PT goals: Progressing toward goals    Frequency  Min 3X/week    PT Plan Current plan remains appropriate  Co-evaluation             End of Session Equipment Utilized During Treatment: Gait belt Activity Tolerance: Patient limited by fatigue;Patient limited by pain Patient left: in chair;with call bell/phone within reach;with chair alarm set     Time: [id number] PT Time Calculation (min) (ACUTE ONLY): 34 min  Charges:  $Therapeutic Activity: 23-37 mins                    G Codes:      Kierstyn Baranowski 02/17/2016, 12:45 PM

## 2016-02-18 DIAGNOSIS — E43 Unspecified severe protein-calorie malnutrition: Secondary | ICD-10-CM

## 2016-02-18 DIAGNOSIS — E871 Hypo-osmolality and hyponatremia: Secondary | ICD-10-CM

## 2016-02-18 DIAGNOSIS — F1020 Alcohol dependence, uncomplicated: Secondary | ICD-10-CM

## 2016-02-18 DIAGNOSIS — W19XXXD Unspecified fall, subsequent encounter: Secondary | ICD-10-CM

## 2016-02-18 DIAGNOSIS — D62 Acute posthemorrhagic anemia: Secondary | ICD-10-CM

## 2016-02-18 DIAGNOSIS — E119 Type 2 diabetes mellitus without complications: Secondary | ICD-10-CM

## 2016-02-18 LAB — BASIC METABOLIC PANEL
ANION GAP: 7 (ref 5–15)
BUN: <5 mg/dL — ABNORMAL LOW (ref 6–20)
CO2: 23 mmol/L (ref 22–32)
Calcium: 8.9 mg/dL (ref 8.9–10.3)
Chloride: 102 mmol/L (ref 101–111)
Creatinine, Ser: 0.49 mg/dL (ref 0.44–1.00)
GFR calc Af Amer: >60 mL/min (ref >60)
Glucose, Bld: 111 mg/dL — ABNORMAL HIGH (ref 65–99)
POTASSIUM: 3.7 mmol/L (ref 3.5–5.1)
SODIUM: 132 mmol/L — AB (ref 135–145)

## 2016-02-18 LAB — CBC
HCT: 20.7 % — ABNORMAL LOW (ref 36.0–46.0)
Hemoglobin: 7.4 g/dL — ABNORMAL LOW (ref 12.0–15.0)
MCH: 34.4 pg — ABNORMAL HIGH (ref 26.0–34.0)
MCHC: 35.7 g/dL (ref 30.0–36.0)
MCV: 96.3 fL (ref 78.0–100.0)
PLATELETS: 219 10*3/uL (ref 150–400)
RBC: 2.15 MIL/uL — AB (ref 3.87–5.11)
RDW: 13.5 % (ref 11.5–15.5)
WBC: 5 10*3/uL (ref 4.0–10.5)

## 2016-02-18 LAB — MAGNESIUM: MAGNESIUM: 1 mg/dL — AB (ref 1.7–2.4)

## 2016-02-18 LAB — TYPE AND SCREEN
ABO/RH(D): O POS
Antibody Screen: NEGATIVE
UNIT DIVISION: 0
UNIT DIVISION: 0

## 2016-02-18 LAB — HEMOGLOBIN AND HEMATOCRIT, BLOOD
HCT: 26.9 % — ABNORMAL LOW (ref 36.0–46.0)
Hemoglobin: 9.7 g/dL — ABNORMAL LOW (ref 12.0–15.0)

## 2016-02-18 LAB — PREPARE RBC (CROSSMATCH)

## 2016-02-18 MED ORDER — SODIUM CHLORIDE 0.9 % IV SOLN: Freq: Once | INTRAVENOUS | Status: DC

## 2016-02-18 MED ORDER — MAGNESIUM SULFATE 4 GM/100ML IV SOLN
4.0000 g | Freq: Once | INTRAVENOUS | Status: AC
  Administered 2016-02-18: 4 g via INTRAVENOUS
  Filled 2016-02-18: qty 100

## 2016-02-18 MED ORDER — THIAMINE HCL 100 MG PO TABS: 100.0000 mg | ORAL_TABLET | Freq: Every day | ORAL | Status: AC

## 2016-02-18 MED ORDER — FOLIC ACID 1 MG PO TABS: 1.0000 mg | ORAL_TABLET | Freq: Every day | ORAL | Status: AC

## 2016-02-18 MED ORDER — SODIUM CHLORIDE 0.9 % IV BOLUS (SEPSIS)
500.0000 mL | Freq: Once | INTRAVENOUS | Status: AC
  Administered 2016-02-18: 500 mL via INTRAVENOUS

## 2016-02-18 NOTE — Progress Notes (Signed)
Physical Therapy Treatment Patient Details Name: Alexis Wang MRN: [id number] DOB: 20-Apr-1951 Today's Date: 02/18/2016    History of Present Illness pt was visiting from [place] and sustained a fall resulting in R intertrochanteric fx. She is s/p IM nail.  PMH significant for ETOH and DM    PT Comments    Pt motivation to self assist continues to improve but pt tolerance ltd by fatigue and pain.  Follow Up Recommendations  SNF     Equipment Recommendations  None recommended by PT    Recommendations for Other Services OT consult     Precautions / Restrictions Precautions Precautions: Fall Restrictions Weight Bearing Restrictions: No RLE Weight Bearing: Weight bearing as tolerated    Mobility  Bed Mobility Overal bed mobility: +2 for physical assistance;Needs Assistance Bed Mobility: Supine to Sit     Supine to sit: HOB elevated;Min assist;+2 for physical assistance;+2 for safety/equipment     General bed mobility comments: Increased time and frequent breaks to complete task.  Cues for sequence and use of L LE to self assist  Transfers Overall transfer level: Needs assistance Equipment used: Rolling walker (2 wheeled) Transfers: Sit to/from Stand Sit to Stand: Mod assist;+2 physical assistance;+2 safety/equipment         General transfer comment: cues for UE/LE placement.  Assist to power up, shift forward and stabilize  Ambulation/Gait Ambulation/Gait assistance: Min assist;Mod assist;+2 physical assistance;+2 safety/equipment Ambulation Distance (Feet): 4 Feet Assistive device: Rolling walker (2 wheeled) Gait Pattern/deviations: Step-to pattern;Decreased step length - right;Decreased step length - left;Shuffle;Trunk flexed Gait velocity: decr Gait velocity interpretation: Below normal speed for age/gender General Gait Details: Increased time but pt able to initiate step with both LEs.  Cues for sequence, posture and position from [employer]             Wheelchair Mobility    Modified Rankin (Stroke Patients Only)       Balance                                    Cognition Arousal/Alertness: Awake/alert Behavior During Therapy: WFL for tasks assessed/performed Overall Cognitive Status: Within Functional Limits for tasks assessed         Following Commands: Follows one step commands consistently            Exercises      General Comments        Pertinent Vitals/Pain Pain Assessment: Faces Faces Pain Scale: Hurts even more Pain Location: R hip with movement Pain Descriptors / Indicators: Aching;Sore Pain Intervention(s): Limited activity within patient's tolerance;Monitored during session;Premedicated before session    Home Living                      Prior Function            PT Goals (current goals can now be found in the care plan section) Acute Rehab PT Goals Patient Stated Goal: Home PT Goal Formulation: With patient Time For Goal Achievement: 02/26/16 Potential to Achieve Goals: Fair Progress towards PT goals: Progressing toward goals    Frequency  Min 3X/week    PT Plan Current plan remains appropriate    Co-evaluation             End of Session Equipment Utilized During Treatment: Gait belt Activity Tolerance: Patient limited by fatigue;Patient limited by pain Patient left: in chair;with call bell/phone within reach;with  family/visitor present     Time: [id number] PT Time Calculation (min) (ACUTE ONLY): 22 min  Charges:  $Gait Training: 8-22 mins                    G Codes:      Alexis Wang 02/18/2016, 12:51 PM

## 2016-02-18 NOTE — Clinical Social Work Placement (Signed)
   CLINICAL SOCIAL WORK PLACEMENT  NOTE  Date:  02/18/2016  Patient Details  Name: Alexis Wang MRN: [id number] Date of Birth: May 17, 1951  Clinical Social Work is seeking post-discharge placement for this patient at the Skilled  Nursing Facility level of care (*CSW will initial, date and re-position this form in  chart as items are completed):  Yes   Patient/family provided with [place] Clinical Social Work Department's list of facilities offering this level of care within the geographic area requested by the patient (or if unable, by the patient's family).  Yes   Patient/family informed of their freedom to choose among providers that offer the needed level of care, that participate in Medicare, Medicaid or managed care program needed by the patient, have an available bed and are willing to accept the patient.  Yes   Patient/family informed of [place]'s ownership interest in [facility] and [facility], as well as of the fact that they are under no obligation to receive care at these facilities.  PASRR submitted to EDS on       PASRR number received on       Existing PASRR number confirmed on       FL2 transmitted to all facilities in geographic area requested by pt/family on 02/15/16     FL2 transmitted to all facilities within larger geographic area on       Patient informed that his/her managed care company has contracts with or will negotiate with certain facilities, including the following:        Yes   Patient/family informed of bed offers received.  [facility]     Physician recommends and patient chooses bed at      [facility].  Patient to be transferred to facility by [facility]     Patient family notified on 02/18/16 of transfer.  Name of family member notified:  SON     PHYSICIAN       Additional Comment: Pt / son are in agreement with d/c to  [facility] today for pvt pay placement. [facility] transport is required. D/C Summary sent to SNF for review. Scripts included in d/c packet. # for report provided to nsg.   _______________________________________________ Royetta AsalHaidinger, Aanshi Batchelder Lee, LCSW  [phone] 02/18/2016, 3:05 PM

## 2016-02-18 NOTE — Discharge Summary (Addendum)
Physician Discharge Summary  Alexis Wang  WJX:[id number]  DOB: [birth year]  DOA: 02/14/2016  PCP: No primary care provider on file.  Admit date: 02/14/2016 Discharge date: 02/18/2016  Time spent: Greater than 30 minutes  Recommendations for Outpatient Follow-up:  1. M.D. at SNF in 3 days. Recommend repeating labs (CBC, BMP & Mg) on 02/19/2016. 2. Dr. Annell Greening, Orthopedics in 1 week. SNF to arrange follow-up appointment.  Discharge Diagnoses:  Principal Problem:   Intertrochanteric fracture of femur (HCC) Active Problems:   Diabetes mellitus without complication (HCC)   Alcohol dependency (HCC)   Hyponatremia   Fall at home   Diet-controlled diabetes mellitus (HCC)   Protein-calorie malnutrition, severe   Acute blood loss anemia   Discharge Condition: Improved & Stable  Diet recommendation: Heart healthy diet.  Filed Weights   02/14/16 0844  Weight: 47.628 kg (105 lb)    History of present illness:  65 year old female patient with history of diet-controlled DM, alcohol dependence, tobacco abuse, HTN, visiting her son from [place], presented to the ED following a mechanical fall. She ambulates with the help of a walker and reports that she got her feet tangled up and fell landing on her right side. She had severe pain in the right hip and was unable to ambulate or bear weight after the fall. Imaging revealed intertrochanteric fracture of the right femur. Orthopedics was consulted and patient was admitted for further management.  Hospital Course:   Right intertrochanteric fracture - Sustained status post fall - Orthopedics was consulted and patient underwent surgical fixation by intramedullary nail on 02/16/16. Postoperatively patient was noted to be sedated and hence several of her medications were discontinued. - As discussed with Dr. Ophelia Charter, orthopedics on 3/31: Cleared for discharge to SNF, agrees with blood transfusion, weightbearing as tolerated on right lower extremity,  dry dressing change to surgical site, aspirin 325 MG daily for postop DVT prophylaxis (patient is already on this prior to admission) and outpatient follow-up with him next week.  Postoperative acute blood loss anemia - Hemoglobin 13 on 3/27, dropped to low of 7.5 on 3/30. Blood transfusion was discussed at length with patient and son including risks and benefits. She contemplated this over the next 24 hours. Hemoglobin 7.4 on 3/31. She consented to blood transfusion and was transfused 1 unit of PRBCs. Posttransfusion hemoglobin 9.7. - Follow CBCs in a couple of days and aim to keep >8 g per DL.  Diet-controlled DM 2 - Hemoglobin A1c 4.8.  Alcohol dependence - Patient understates the amount of alcohol she consumes. Continue thiamine, folate and multivitamins. No overt withdrawal at this time. Abstinence or moderation counseled.  Hyponatremia - Likely secondary to beer potomania. - Stable.  Tobacco abuse - Cessation counseled. Patient indicated that she quit and declined a nicotine patch.  Severe malnutrition in the context of chronic illness, underweight - As per dietitian recommendations.  Bilateral hip osteoarthritis - Vitamin D 25-hydroxy: 41.6.  Hypokalemia - replaced  Hypomagnesemia - Replace IV prior to DC and follow up at [facility].    Consultations:  Orthopedics  Procedures:  IM nail femoral, right on 02/16/16    Discharge Exam:  Complaints:  Appropriate postop pain right hip site. Denies chest pain, dyspnea, dizziness, lightheadedness. Weakness is better.  Filed Vitals:   02/18/16 0504 02/18/16 1405 02/18/16 1421 02/18/16 1439  BP: 114/66 107/64 110/66 113/68  Pulse: 86 88 86 78  Temp: 98.1 F (36.7 C) 97.9 F (36.6 C) 98.8 F (37.1 C) 98.8 F (37.1 C)  TempSrc: Oral Oral Oral Oral  Resp: Height:      Weight:      SpO2: 99% 100% 99% 100%    General exam: Pleasant middle-aged female, sitting up comfortably on chair this  morning. Respiratory system: Clear. No increased work of breathing. Cardiovascular system: S1 & S2 heard, RRR. No JVD, murmurs, gallops, clicks or pedal edema. Gastrointestinal system: Abdomen is nondistended, soft and nontender. Normal bowel sounds heard. Central nervous system: Alert and oriented. No focal neurological deficits. Extremities: Symmetric 5 x 5 power. Right hip surgical site dressing clean and dry. No acute findings.  Discharge Instructions      Discharge Instructions    Call MD for:  difficulty breathing, headache or visual disturbances    Complete by:  As directed      Call MD for:  extreme fatigue    Complete by:  As directed      Call MD for:  persistant dizziness or light-headedness    Complete by:  As directed      Call MD for:  persistant nausea and vomiting    Complete by:  As directed      Call MD for:  redness, tenderness, or signs of infection (pain, swelling, redness, odor or green/yellow discharge around incision site)    Complete by:  As directed      Call MD for:  severe uncontrolled pain    Complete by:  As directed      Call MD for:  temperature >100.4    Complete by:  As directed      Diet - low sodium heart healthy    Complete by:  As directed      Discharge wound care:    Complete by:  As directed   Dry dressing change to surgery site daily and as needed for soiling.     Increase activity slowly    Complete by:  As directed      Weight bearing as tolerated    Complete by:  As directed   Laterality:  bilateral            Medication List    STOP taking these medications        ibuprofen 200 MG tablet  Commonly known as:  ADVIL,MOTRIN     oxyCODONE 5 MG immediate release tablet  Commonly known as:  Oxy IR/ROXICODONE      TAKE these medications        aspirin 325 MG tablet  Take 325 mg by mouth daily.     calcium-vitamin D 500-200 MG-UNIT tablet  Commonly known as:  OSCAL WITH D  Take 1 tablet by mouth daily.     folic acid 1  MG tablet  Commonly known as:  FOLVITE  Take 1 tablet (1 mg total) by mouth daily.     oxyCODONE-acetaminophen 5-325 MG tablet  Commonly known as:  ROXICET  Take 1 tablet by mouth every 6 (six) hours as needed for severe pain.     prenatal multivitamin Tabs tablet  Take 1 tablet by mouth daily at 12 noon.     thiamine 100 MG tablet  Take 1 tablet (100 mg total) by mouth daily.       Follow-up Information    Follow up with Eldred Manges, MD. Schedule an appointment as soon as possible for a visit in 1 week.   Specialty:  Orthopedic Surgery   Contact information:   [address] [place]  [address] [phone]       Follow up with [facility].   Specialty:  Skilled Nursing Facility   Why:  Recommend repeating labs (CBC, BMP, magnesium) on 02/19/16.   Contact information:   [address] [place] [address] [phone]      Follow up with M.D. at SNF. Schedule an appointment as soon as possible for a visit in 3 days.      Get Medicines reviewed and adjusted: Please take all your medications with you for your next visit with your Primary MD  Please request your Primary MD to go over all hospital tests and procedure/radiological results at the follow up. Please ask your Primary MD to get all Hospital records sent to his/her office.  If you experience worsening of your admission symptoms, develop shortness of breath, life threatening emergency, suicidal or homicidal thoughts you must seek medical attention immediately by calling 911 or calling your MD immediately if symptoms less severe.  You must read complete instructions/literature along with all the possible adverse reactions/side effects for all the Medicines you take and that have been prescribed to you. Take any new Medicines after you have completely understood and accept all the possible adverse reactions/side effects.   Do not drive when taking pain medications.    Do not take more than prescribed Pain, Sleep and Anxiety Medications  Special Instructions: If you have smoked or chewed Tobacco in the last 2 yrs please stop smoking, stop any regular Alcohol and or any Recreational drug use.  Wear Seat belts while driving.  Please note  You were cared for by a hospitalist during your hospital stay. Once you are discharged, your primary care physician will handle any further medical issues. Please note that NO REFILLS for any discharge medications will be authorized once you are discharged, as it is imperative that you return to your primary care physician (or establish a relationship with a primary care physician if you do not have one) for your aftercare needs so that they can reassess your need for medications and monitor your lab values.    The results of significant diagnostics from this hospitalization (including imaging, microbiology, ancillary and laboratory) are listed below for reference.    Significant Diagnostic Studies: Dg Chest 1 View  02/14/2016  CLINICAL DATA:  Larey Seat in living room tonight.  RIGHT hip pain. EXAM: CHEST 1 VIEW COMPARISON:  None. FINDINGS: The heart size and mediastinal contours are within normal limits. Mild calcific atherosclerosis of the descending aorta. Both lungs are clear. The visualized skeletal structures are unremarkable. IMPRESSION: No acute cardiopulmonary process. Electronically Signed   By: Awilda Metro M.D.   On: 02/14/2016 06:39   Dg C-arm 1-60 Min-no Report  02/15/2016  CLINICAL DATA: hip C-ARM 1-60 MINUTES Fluoroscopy was utilized by the requesting physician.  No radiographic interpretation.   Dg Hip Unilat With Pelvis 2-3 Views Right  02/15/2016  CLINICAL DATA:  Intramedullary nail right femur for intertrochanteric fracture EXAM: DG C-ARM 1-60 MIN-NO REPORT; DG HIP (WITH OR WITHOUT PELVIS) 2-3V RIGHT FLUOROSCOPY TIME:  Fluoroscopy Time (in minutes and seconds): 0 min 39 seconds Number of Acquired  Images:  7 COMPARISON:  02/14/16 FINDINGS: IM nail in right femur in anticipated position. IMPRESSION: Fluoroscopy for IM nail placement Electronically Signed   By: Esperanza Heir M.D.   On: 02/15/2016 19:26   Dg Hip Unilat With Pelvis 2-3 Views Right  02/14/2016  CLINICAL DATA:  Larey Seat in living room tonight.  RIGHT hip pain. EXAM: DG HIP (WITH OR WITHOUT PELVIS) 2-3V RIGHT COMPARISON:  None. FINDINGS: Acute RIGHT femur intertrochanteric fracture with mild impaction, in general alignment. Age indeterminate LEFT superior pubic ramus nondisplaced fracture. No dislocation. Osteopenia without destructive bony lesions. Severe degenerative change of the LEFT hip with mild femoral head collapse. Soft tissue planes are nonsuspicious. IMPRESSION: Acute mildly impacted RIGHT femur intertrochanteric fracture. No dislocation. 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 Age indeterminate LEFT superior pubic ramus nondisplaced fracture. Electronically Signed   By: Awilda Metroourtnay  Bloomer M.D.   On: 02/14/2016 06:41    Microbiology: Recent Results (from the past 240 hour(s))  Surgical pcr screen     Status: None   Collection Time: 02/14/16  1:15 PM  Result Value Ref Range Status   MRSA, PCR NEGATIVE NEGATIVE Final   Staphylococcus aureus NEGATIVE NEGATIVE Final    Comment:        The Xpert SA Assay (FDA approved for NASAL specimens in patients over 63 years of age), is one component of a comprehensive surveillance program.  Test performance has been validated by [facility] for patients greater than or equal to 63 year old. It is not intended to diagnose infection nor to guide or monitor treatment.      Labs: Basic Metabolic Panel:  Recent Labs Lab 02/14/16 0515 02/15/16 0421 02/15/16 2207 02/16/16 0405 02/17/16 0410 02/18/16 0406  NA 127* 128*  --  130* 132* 132*  K 3.9 4.0  --  3.8 3.4* 3.7  CL 90* 95*  --  99* 101 102  CO2 20* 21*  --  22 23 23   GLUCOSE  71 120*  --  215* 111* 111*  BUN 6 6  --  <5* 5* <5*  CREATININE 0.60 0.50 0.54 0.51 0.45 0.49  CALCIUM 10.1 9.4  --  9.3 9.0 8.9  MG  --   --   --   --   --  1.0*   Liver Function Tests:  Recent Labs Lab 02/14/16 0515  AST 48*  ALT 25  ALKPHOS 136*  BILITOT 0.8  PROT 7.4  ALBUMIN 3.7   No results for input(s): LIPASE, AMYLASE in the last 168 hours. No results for input(s): AMMONIA in the last 168 hours. CBC:  Recent Labs Lab 02/14/16 0515 02/15/16 2207 02/16/16 0405 02/17/16 0410 02/18/16 0406  WBC 6.5 6.9 6.6 4.9 5.0  NEUTROABS 5.1  --   --   --   --   HGB 13.5 10.1* 9.1* 7.5* 7.4*  HCT 36.5 28.4* 25.3* 20.7* 20.7*  MCV 94.6 97.3 96.6 96.7 96.3  PLT 365 188 189 175 219   Cardiac Enzymes: No results for input(s): CKTOTAL, CKMB, CKMBINDEX, TROPONINI in the last 168 hours. BNP: BNP (last 3 results) No results for input(s): BNP in the last 8760 hours.  ProBNP (last 3 results) No results for input(s): PROBNP in the last 8760 hours.  CBG:  Recent Labs Lab 02/15/16 2227 02/16/16 0718 02/16/16 1200 02/16/16 1717 02/16/16 2124  GLUCAP 186* 187* 147* 115* 117*       Signed:  Marcellus ScottHONGALGI,Kely Dohn, MD, FACP, FHM. [facility] Pager [phone]  If 7PM-7AM, please contact night-coverage [email or web address] Password [email or web address] 02/18/2016, 4:46 PM

## 2016-02-18 NOTE — Progress Notes (Signed)
Patient discharged via EMS in stable condition to [facility].  Family at bedside.

## 2016-02-18 NOTE — Progress Notes (Signed)
OT Cancellation Note  Patient Details Name: Alexis Wang MRN: [id number] DOB: 04/13/1951   Cancelled Treatment:    Reason Eval/Treat Not Completed: Other (comment).  Pt was eating when OT stopped by and is now back in bed with plan for blood transfusion. Will check back over the weekend or on Monday.  Kahlan Engebretson 02/18/2016, 12:12 PM  Marica OtterMaryellen Henley Boettner, OTR/L [phone] 02/18/2016

## 2016-02-18 NOTE — Discharge Instructions (Signed)
Hip Fracture A hip fracture is a fracture of the upper part of your thigh bone (femur).  CAUSES A hip fracture is caused by a direct blow to the side of your hip. This is usually the result of a fall but can occur in other circumstances, such as an automobile accident. RISK FACTORS There is an increased risk of hip fractures in people with:  An unsteady walking pattern (gait) and those with conditions that contribute to poor balance, such as Parkinson's disease or dementia.  Osteopenia and osteoporosis.  Cancer that spreads to the leg bones.  Certain metabolic diseases. SYMPTOMS  Symptoms of hip fracture include:  Pain over the injured hip.  Inability to put weight on the leg in which the fracture occurred (although, some patients are able to walk after a hip fracture).  Toes and foot of the affected leg point outward when you lie down. DIAGNOSIS A physical exam can determine if a hip fracture is likely to have occurred. X-ray exams are needed to confirm the fracture and to look for other injuries. The X-ray exam can help to determine the type of hip fracture. Rarely, the fracture is not visible on an X-ray image and a CT scan or MRI will have to be done. TREATMENT  The treatment for a fracture is usually surgery. This means using a screw, nail, or rod to hold the bones in place.  HOME CARE INSTRUCTIONS Take all medicines as directed by your health care provider. SEEK MEDICAL CARE IF: Pain continues, even after taking pain medicine. MAKE SURE YOU:  Understand these instructions.   Will watch your condition.  Will get help right away if you are not doing well or get worse.   This information is not intended to replace advice given to you by your health care provider. Make sure you discuss any questions you have with your health care provider.   Document Released: 11/06/2005 Document Revised: 11/11/2013 Document Reviewed: 06/18/2013 Elsevier Interactive Patient Education 2016  Elsevier Inc.  

## 2016-02-18 NOTE — Care Management Note (Signed)
Case Management Note  Patient Details  Name: Roland EarlDiane Glace MRN: [id number] Date of Birth: July 24, 1951  Subjective/Objective:   Admitted with hip fracture, s/p IM nailing                 Action/Plan: Discharge planning per CSW  Expected Discharge Date:                  Expected Discharge Plan:  Skilled Nursing Facility  In-House Referral:  Clinical Social Work  Discharge planning Services  CM Consult  Post Acute Care Choice:  NA Choice offered to:  NA  DME Arranged:  N/A DME Agency:  NA  HH Arranged:  NA HH Agency:  NA  Status of Service:  Completed, signed off  Medicare Important Message Given:    Date Medicare IM Given:    Medicare IM give by:    Date Additional Medicare IM Given:    Additional Medicare Important Message give by:     If discussed at Long Length of Stay Meetings, dates discussed:    Additional Comments:  Alexis Goodelleele, Aslynn Brunetti K, RN 02/18/2016, 9:57 AM [phone]

## 2016-02-18 NOTE — Progress Notes (Signed)
Physical Therapy Treatment Patient Details Name: Alexis EarlDiane Chisum MRN: [id number] DOB: 1951/03/16 Today's Date: 02/18/2016    History of Present Illness pt was visiting from [place] and sustained a fall resulting in R intertrochanteric fx. She is s/p IM nail.  PMH significant for ETOH and DM    PT Comments    Pt progressing slowly but demonstrating increased motivation to self-assist.  Follow Up Recommendations  SNF     Equipment Recommendations  None recommended by PT    Recommendations for Other Services OT consult     Precautions / Restrictions Precautions Precautions: Fall Restrictions Weight Bearing Restrictions: No RLE Weight Bearing: Weight bearing as tolerated    Mobility  Bed Mobility Overal bed mobility: Needs Assistance;+ 2 for safety/equipment Bed Mobility: Sit to Supine     Supine to sit: HOB elevated;Min assist;+2 for physical assistance;+2 for safety/equipment Sit to supine: Min assist;+2 for physical assistance;+2 for safety/equipment;HOB elevated   General bed mobility comments: Increased time and frequent breaks to complete task.  Cues for sequence and use of L LE to self assist  Transfers Overall transfer level: Needs assistance Equipment used: Rolling walker (2 wheeled) Transfers: Sit to/from Stand Sit to Stand: Mod assist;+2 physical assistance;+2 safety/equipment         General transfer comment: cues for UE/LE placement.  Assist to power up, shift forward and stabilize  Ambulation/Gait Ambulation/Gait assistance: Min assist;Mod assist;+2 safety/equipment Ambulation Distance (Feet): 3 Feet Assistive device: Rolling walker (2 wheeled) Gait Pattern/deviations: Step-to pattern;Decreased step length - right;Decreased step length - left;Shuffle;Trunk flexed Gait velocity: decr Gait velocity interpretation: Below normal speed for age/gender General Gait Details: Increased time but pt able to initiate step with both LEs.  Cues for sequence, posture and  position from [employer]   Stairs            Wheelchair Mobility    Modified Rankin (Stroke Patients Only)       Balance   Sitting-balance support: Feet supported;No upper extremity supported Sitting balance-Leahy Scale: Fair     Standing balance support: Bilateral upper extremity supported Standing balance-Leahy Scale: Poor                      Cognition Arousal/Alertness: Awake/alert Behavior During Therapy: WFL for tasks assessed/performed Overall Cognitive Status: Within Functional Limits for tasks assessed         Following Commands: Follows one step commands consistently            Exercises      General Comments        Pertinent Vitals/Pain Pain Assessment: Faces Faces Pain Scale: Hurts even more Pain Location: R hip with movement Pain Descriptors / Indicators: Aching;Sore Pain Intervention(s): Limited activity within patient's tolerance;Monitored during session;Premedicated before session    Home Living                      Prior Function            PT Goals (current goals can now be found in the care plan section) Acute Rehab PT Goals Patient Stated Goal: Home PT Goal Formulation: With patient Time For Goal Achievement: 02/26/16 Potential to Achieve Goals: Fair Progress towards PT goals: Progressing toward goals    Frequency  Min 3X/week    PT Plan Current plan remains appropriate    Co-evaluation             End of Session Equipment Utilized During Treatment: Gait belt Activity Tolerance: Patient  limited by fatigue;Patient limited by pain Patient left: in bed;with call bell/phone within reach;with bed alarm set     Time: 1610-9604 PT Time Calculation (min) (ACUTE ONLY): 17 min  Charges:  $Gait Training: 8-22 mins $Therapeutic Activity: 8-22 mins                    G Codes:      Leana Springston March 10, 2016, 12:58 PM

## 2016-02-18 NOTE — Progress Notes (Addendum)
Patient ID: [name], female   DOB: Mar 22, 1951, 65 y.o.   MRN: [id number] Agree with plan for transfusion of one unit of PRBC's.   She is WBAT and OK for dry dressing change PRN.  Lives in [place] and I can see her next week in office.  If she flies back early she will be following up with Orthopedist in [place].                        Ophelia CharterYates [phone] cell

## 2016-02-21 ENCOUNTER — Encounter: Payer: Self-pay | Admitting: Adult Health

## 2016-02-21 ENCOUNTER — Non-Acute Institutional Stay (SKILLED_NURSING_FACILITY): Payer: Self-pay | Admitting: Adult Health

## 2016-02-21 DIAGNOSIS — S72141D Displaced intertrochanteric fracture of right femur, subsequent encounter for closed fracture with routine healing: Secondary | ICD-10-CM

## 2016-02-21 DIAGNOSIS — I10 Essential (primary) hypertension: Secondary | ICD-10-CM

## 2016-02-21 DIAGNOSIS — K219 Gastro-esophageal reflux disease without esophagitis: Secondary | ICD-10-CM

## 2016-02-21 DIAGNOSIS — D62 Acute posthemorrhagic anemia: Secondary | ICD-10-CM

## 2016-02-21 LAB — MAGNESIUM: MAGNESIUM: 1.5

## 2016-02-21 LAB — TYPE AND SCREEN
ABO/RH(D): O POS
Antibody Screen: NEGATIVE
UNIT DIVISION: 0

## 2016-02-21 NOTE — Progress Notes (Signed)
Patient ID: Alexis EarlDiane Wang, female   DOB: 25-Nov-1950, 65 y.o.   MRN: [id number]   Facility:  [facility]       Allergies  Allergen Reactions  . Penicillins     Has patient had a PCN reaction causing immediate rash, facial/tongue/throat swelling, SOB or lightheadedness with hypotension:Yes Has patient had a PCN reaction causing severe rash involving mucus membranes or skin necrosis:No Has patient had a PCN reaction that required hospitalization:No Has patient had a PCN reaction occurring within the last 10 years:No If all of the above answers are "NO", then may proceed with Cephalosporin use.     Chief Complaint  Patient presents with  . Hospitalization Follow-up    Hospital Follow up    HPI:  She is has been hospitalized for a right hip fracture. She is here for short term rehab with her goal to return back home. She is complaining of right hip which is not being managed adequately and is complaining of heartburn. she would like her pain medication adjusted to better manage her pain. There are no nursing concerns at this time.     Past Medical History  Diagnosis Date  . Diabetes mellitus without complication (HCC)   . HTN (hypertension)   . OA (osteoarthritis)   . Pelvic fracture (HCC) 2015  . Alcohol dependency [facility](HCC)     Past Surgical History  Procedure Laterality Date  . Appendectomy    . Femur im nail Right 02/15/2016    Procedure: INTRAMEDULLARY (IM) NAIL FEMORAL;  Surgeon: Eldred MangesMark C Yates, MD;  Location: [facility];  Service: Orthopedics;  Laterality: Right;    VITAL SIGNS BP 142/94 mmHg  Pulse 88  Temp(Src) 97 F (36.1 C) (Oral)  Resp 20  Ht 5\' 5"  (1.651 m)  Wt 105 lb (47.628 kg)  BMI 17.47 kg/m2  SpO2 98%  Patient's Medications  New Prescriptions   No medications on file  Previous Medications   ASPIRIN 325 MG TABLET    Take 325 mg by mouth daily.   CALCIUM-VITAMIN D (OSCAL WITH D) 500-200 MG-UNIT TABLET    Take 1 tablet by mouth daily.   FOLIC ACID (FOLVITE) 1  MG TABLET    Take 1 tablet (1 mg total) by mouth daily.   OXYCODONE-ACETAMINOPHEN (ROXICET) 5-325 MG TABLET    Take 1 tablet by mouth every 6 (six) hours as needed for severe pain.   PRENATAL VIT-FE FUMARATE-FA (PRENATAL MULTIVITAMIN) TABS TABLET    Take 1 tablet by mouth daily at 12 noon.   THIAMINE 100 MG TABLET    Take 1 tablet (100 mg total) by mouth daily.  Modified Medications   No medications on file  Discontinued Medications   No medications on file     SIGNIFICANT DIAGNOSTIC EXAMS  02-14-16: pelvic and hip x-ray: Acute mildly impacted RIGHT femur intertrochanteric fracture. No dislocation. 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 Age indeterminate LEFT superior pubic ramus nondisplaced fracture.  02-14-16: chest x-ray: No acute cardiopulmonary process   LABS REVIEWED:   02-14-16: wbc 6.5 hgb 13.5; hct 36.5; mcv 94.6; plt 365; glucose 71; bun 6; creat 0.60; k+ 3.9; na++127; liver normal albumin 3.7; GGT 204; hgb a1c 4.8; vit D 41.6 02-18-16: wbc 5.0; hgb 7.4; hct 20.7; mcv 96.3; plt 219; glucose 111; bun <5; creat 0.49; k+ 3.7; na++132 02-19-16: wbc 7.6;hgb 11.0; hct 32.0; mcv 96.4 plt 281; glucose 120; bun 4.3; creat 0.36; k+ 4.0; na++127; mag 1.5    Review  of Systems  Constitutional: Negative for malaise/fatigue.  Respiratory: Negative for cough and shortness of breath.   Cardiovascular: Negative for chest pain, palpitations and leg swelling.  Gastrointestinal: Positive for heartburn. Negative for abdominal pain and constipation.  Musculoskeletal: Positive for joint pain. Negative for myalgias and back pain.       Has right hip pain   Skin: Negative.   Neurological: Negative for dizziness.  Psychiatric/Behavioral: The patient is not nervous/anxious.     Physical Exam  Constitutional: She is oriented to person, place, and time. No distress.  Frail   Eyes: Conjunctivae are normal.  Neck: Neck supple. No JVD present. No thyromegaly  present.  Cardiovascular: Normal rate, regular rhythm and intact distal pulses.   Respiratory: Effort normal and breath sounds normal. No respiratory distress. She has no wheezes.  GI: Soft. Bowel sounds are normal. She exhibits no distension. There is no tenderness.  Musculoskeletal: She exhibits no edema.  Able to move all extremities  Is status post right hip fracture   Lymphadenopathy:    She has no cervical adenopathy.  Neurological: She is alert and oriented to person, place, and time.  Skin: Skin is warm and dry. She is not diaphoretic.  Hips incision without signs of infection present   Psychiatric: She has a normal mood and affect.      ASSESSMENT/ PLAN:  1. Right hip fracture: will follow up with orthopedics as indicated and will continue therapy as directed. Will stop the percocet and will begin oxycodone 5 mg every 6 hours routinely and every 3 hours as needed for pain management and will monitor her status.   2. Genella Rife: will begin nexium 20 mg daily   3. Hypertension: is presently not on medications will not make changes will monitor will continue asa 325 mg daily   4. Anemia: hgb is 11.0; will monitor   Time spent with patient  50   minutes >50% time spent counseling; reviewing medical record; tests; labs; and developing future plan of care     Synthia Innocent NP [facility]  Contact [phone] Monday through Friday 8am- 5pm  After hours call [phone]

## 2016-02-21 NOTE — Progress Notes (Deleted)
Facility:  [facility]       Allergies  Allergen Reactions  . Penicillins     Has patient had a PCN reaction causing immediate rash, facial/tongue/throat swelling, SOB or lightheadedness with hypotension:Yes Has patient had a PCN reaction causing severe rash involving mucus membranes or skin necrosis:No Has patient had a PCN reaction that required hospitalization:No Has patient had a PCN reaction occurring within the last 10 years:No If all of the above answers are "NO", then may proceed with Cephalosporin use.     Chief Complaint  Patient presents with  . Hospitalization Follow-up    Hospital Follow up    HPI:    Past Medical History  Diagnosis Date  . Diabetes mellitus without complication (HCC)   . HTN (hypertension)   . OA (osteoarthritis)   . Pelvic fracture (HCC) 2015  . Alcohol dependency [facility](HCC)     Past Surgical History  Procedure Laterality Date  . Appendectomy    . Femur im nail Right 02/15/2016    Procedure: INTRAMEDULLARY (IM) NAIL FEMORAL;  Surgeon: Eldred MangesMark C Yates, MD;  Location: [facility];  Service: Orthopedics;  Laterality: Right;    VITAL SIGNS BP 142/94 mmHg  Pulse 20  Temp(Src) 97 F (36.1 C) (Oral)  Ht 5\' 5"  (1.651 m)  Wt 105 lb (47.628 kg)  BMI 17.47 kg/m2  SpO2 98%  Patient's Medications  New Prescriptions   No medications on file  Previous Medications   ASPIRIN 325 MG TABLET    Take 325 mg by mouth daily.   CALCIUM-VITAMIN D (OSCAL WITH D) 500-200 MG-UNIT TABLET    Take 1 tablet by mouth daily.   FOLIC ACID (FOLVITE) 1 MG TABLET    Take 1 tablet (1 mg total) by mouth daily.   OXYCODONE-ACETAMINOPHEN (ROXICET) 5-325 MG TABLET    Take 1 tablet by mouth every 6 (six) hours as needed for severe pain.   PRENATAL VIT-FE FUMARATE-FA (PRENATAL MULTIVITAMIN) TABS TABLET    Take 1 tablet by mouth daily at 12 noon.   THIAMINE 100 MG TABLET    Take 1 tablet (100 mg total) by mouth daily.  Modified Medications   No medications on file  Discontinued  Medications   No medications on file     SIGNIFICANT DIAGNOSTIC EXAMS       ASSESSMENT/ PLAN:    Synthia Innocenteborah Green NP [facility]  Contact [phone] Monday through Friday 8am- 5pm  After hours call [phone]

## 2016-02-24 ENCOUNTER — Non-Acute Institutional Stay (SKILLED_NURSING_FACILITY): Payer: Self-pay | Admitting: Internal Medicine

## 2016-02-24 DIAGNOSIS — E876 Hypokalemia: Secondary | ICD-10-CM

## 2016-02-24 DIAGNOSIS — S72141D Displaced intertrochanteric fracture of right femur, subsequent encounter for closed fracture with routine healing: Secondary | ICD-10-CM

## 2016-02-24 DIAGNOSIS — D62 Acute posthemorrhagic anemia: Secondary | ICD-10-CM

## 2016-02-24 DIAGNOSIS — F1020 Alcohol dependence, uncomplicated: Secondary | ICD-10-CM

## 2016-02-24 DIAGNOSIS — E119 Type 2 diabetes mellitus without complications: Secondary | ICD-10-CM

## 2016-02-24 DIAGNOSIS — E871 Hypo-osmolality and hyponatremia: Secondary | ICD-10-CM

## 2016-02-24 DIAGNOSIS — E43 Unspecified severe protein-calorie malnutrition: Secondary | ICD-10-CM

## 2016-02-24 NOTE — Progress Notes (Signed)
MRN: [id number] Name: Alexis Wang  Sex: female Age: 65 y.o. DOB: [birth year]  [facility] Level Of Care: SNF Provider: Merrilee SeashoreALEXANDER, ANNE Wang Emergency Contacts: Extended Emergency Contact Information Primary Emergency Contact: Alexis HospitalBarton,Alexis Address: [address]          [place], [place] [id number] [place] Home Phone: [phone] Relation: Son Secondary Emergency Contact: Alexis CoderBarton,Alexis  [place] Work Phone: [phone] Relation: Daughter  Code Status:   Allergies: Penicillins  Chief Complaint  Patient presents with  . New Admit To SNF    HPI: Patient is 65 y.o. female with history of diet-controlled DM, alcohol dependence, tobacco abuse, HTN, visiting her son from [place], presented to the ED following a mechanical fall. She ambulates with the help of a walker and reports that she got her feet tangled up and fell landing on her right side. [facility] where she underwent a surgical fixation with intramedullary nail. Wang course was complicated by blood loss, requiring transfusion and electrolyte abnormalities common to alcoholics. Pt is admitted to SNF for generalized weakness for OT/PT. While at SNF pt will be followed for DM, controlled by diet, ETOH abuse, tx with thiamine and folate and osteo[orosis tx with calcium and Vit Wang.  Past Medical History  Diagnosis Date  . Diabetes mellitus without complication (HCC)   . HTN (hypertension)   . OA (osteoarthritis)   . Pelvic fracture (HCC) 2015  . Alcohol dependency [facility](HCC)     Past Surgical History  Procedure Laterality Date  . Appendectomy    . Femur im nail Right 02/15/2016    Procedure: INTRAMEDULLARY (IM) NAIL FEMORAL;  Surgeon: Eldred MangesMark C Yates, MD;  Location: [facility];  Service: Orthopedics;  Laterality: Right;      Medication List       This list is accurate as of: 02/24/16 11:59 PM.  Always use your most recent med list.               aspirin  325 MG tablet  Take 325 mg by mouth daily.     calcium-vitamin Wang 500-200 MG-UNIT tablet  Commonly known as:  OSCAL WITH Wang  Take 1 tablet by mouth daily.     folic acid 1 MG tablet  Commonly known as:  FOLVITE  Take 1 tablet (1 mg total) by mouth daily.     oxyCODONE-acetaminophen 5-325 MG tablet  Commonly known as:  ROXICET  Take 1 tablet by mouth every 6 (six) hours as needed for severe pain.     prenatal multivitamin Tabs tablet  Take 1 tablet by mouth daily at 12 noon.     thiamine 100 MG tablet  Take 1 tablet (100 mg total) by mouth daily.        No orders of the defined types were placed in this encounter.     There is no immunization history on file for this patient.  Social History  Substance Use Topics  . Smoking status: Current Every Day Smoker -- 0.50 packs/day for 25 years    Types: Cigarettes  . Smokeless tobacco: Not on file  . Alcohol Use: 0.0 oz/week    0 Standard drinks or equivalent per week     Comment: 2 beers a day, 2-3 mixed drinks at night.    Family history is + DM, CA   Review of Systems  - UTO 2/2 dementia    Filed Vitals:   02/24/16 1223  BP: 142/94  Pulse: 88  Temp: 97 F (36.1 C)  Resp: 20    SpO2 Readings from Last 1 Encounters:  02/24/16 98%        Physical Exam  GENERAL APPEARANCE: Alert, min conversant,  No acute distress.  SKIN: No diaphoresis rash; incision site - normal swelling, mild heat HEAD: Normocephalic, atraumatic  EYES: Conjunctiva/lids clear. Pupils round, reactive. EOMs intact.  EARS: External exam WNL, canals clear. Hearing grossly normal.  NOSE: No deformity or discharge.  MOUTH/THROAT: Lips w/o lesions  RESPIRATORY: Breathing is even, unlabored. Lung sounds are clear   CARDIOVASCULAR: Heart RRR no murmurs, rubs or gallops. No peripheral edema.   GASTROINTESTINAL: Abdomen is soft, non-tender, not distended w/ normal bowel sounds. GENITOURINARY: Bladder non tender, not distended  MUSCULOSKELETAL:  No abnormal joints or musculature NEUROLOGIC:  Cranial nerves 2-12 grossly intact. Moves all extremities  PSYCHIATRIC: Mood and affect appropriate to situation with dementia, no behavioral issues  Patient Active Problem List   Diagnosis Date Noted  . Postoperative anemia due to acute blood loss 02/27/2016  . Hypomagnesemia 02/27/2016  . Hypokalemia 02/27/2016  . Acute blood loss anemia 02/18/2016  . Protein-calorie malnutrition, severe 02/15/2016  . Intertrochanteric fracture of femur (HCC) 02/14/2016  . Alcohol dependency (HCC) 02/14/2016  . Hyponatremia 02/14/2016  . Fall at home 02/14/2016  . Diet-controlled diabetes mellitus (HCC) 02/14/2016  . Diabetes mellitus without complication (HCC)   . HTN (hypertension)   . OA (osteoarthritis)   . Essential hypertension   . Arthritis, senescent     CBC    Component Value Date/Time   WBC 5.0 02/18/2016 0406   WBC 7.6 02/18/2006   RBC 2.15* 02/18/2016 0406   HGB 9.7* 02/18/2016 1835   HCT 26.9* 02/18/2016 1835   PLT 219 02/18/2016 0406   MCV 96.3 02/18/2016 0406   LYMPHSABS 0.7 02/14/2016 0515   MONOABS 0.6 02/14/2016 0515   EOSABS 0.1 02/14/2016 0515   BASOSABS 0.0 02/14/2016 0515    CMP     Component Value Date/Time   NA 132* 02/18/2016 0406   NA 127* 02/18/2006   K 3.7 02/18/2016 0406   CL 102 02/18/2016 0406   CO2 23 02/18/2016 0406   GLUCOSE 111* 02/18/2016 0406   BUN <5* 02/18/2016 0406   BUN 4 02/18/2006   CREATININE 0.49 02/18/2016 0406   CREATININE 0.4* 02/18/2006   CALCIUM 8.9 02/18/2016 0406   PROT 7.4 02/14/2016 0515   ALBUMIN 3.7 02/14/2016 0515   AST 48* 02/14/2016 0515   ALT 25 02/14/2016 0515   ALKPHOS 136* 02/14/2016 0515   BILITOT 0.8 02/14/2016 0515   GFRNONAA >60 02/18/2016 0406   GFRAA >60 02/18/2016 0406    Lab Results  Component Value Date   HGBA1C 4.8 02/14/2016     Dg Chest 1 View  02/14/2016  CLINICAL DATA:  Larey Seat in living room tonight.  RIGHT hip pain. EXAM: CHEST 1 VIEW  COMPARISON:  None. FINDINGS: The heart size and mediastinal contours are within normal limits. Mild calcific atherosclerosis of the descending aorta. Both lungs are clear. The visualized skeletal structures are unremarkable. IMPRESSION: No acute cardiopulmonary process. Electronically Signed   By: Awilda Metro M.Wang.   On: 02/14/2016 06:39   Dg C-arm 1-60 Min-no Report  02/15/2016  CLINICAL DATA: hip C-ARM 1-60 MINUTES Fluoroscopy was utilized by the requesting physician.  No radiographic interpretation.   Dg Hip Unilat With Pelvis 2-3 Views Right  02/15/2016  CLINICAL DATA:  Intramedullary nail right femur for intertrochanteric fracture EXAM: DG C-ARM 1-60  MIN-NO REPORT; DG HIP (WITH OR WITHOUT PELVIS) 2-3V RIGHT FLUOROSCOPY TIME:  Fluoroscopy Time (in minutes and seconds): 0 min 39 seconds Number of Acquired Images:  7 COMPARISON:  02/14/16 FINDINGS: IM nail in right femur in anticipated position. IMPRESSION: Fluoroscopy for IM nail placement Electronically Signed   By: Esperanza Heir M.Wang.   On: 02/15/2016 19:26   Dg Hip Unilat With Pelvis 2-3 Views Right  02/14/2016  CLINICAL DATA:  Larey Seat in living room tonight. RIGHT hip pain. EXAM: DG HIP (WITH OR WITHOUT PELVIS) 2-3V RIGHT COMPARISON:  None. FINDINGS: Acute RIGHT femur intertrochanteric fracture with mild impaction, in general alignment. Age indeterminate LEFT superior pubic ramus nondisplaced fracture. No dislocation. Osteopenia without destructive bony lesions. Severe degenerative change of the LEFT hip with mild femoral head collapse. Soft tissue planes are nonsuspicious. IMPRESSION: Acute mildly impacted RIGHT femur intertrochanteric fracture. No dislocation. 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 Age indeterminate LEFT superior pubic ramus nondisplaced fracture. Electronically Signed   By: Awilda Metro M.Wang.   On: 02/14/2016 06:41    Not all labs, radiology exams or other studies done during  hospitalization come through on my EPIC note; however they are reviewed by me.    Assessment and Plan  Intertrochanteric fracture of femur (HCC) Sustained status post fall - Orthopedics was consulted and patient underwent surgical fixation by intramedullary nail on 02/16/16. Postoperatively patient was noted to be sedated and hence several of her medications were discontinued. - As discussed with Dr. Ophelia Charter, orthopedics on 3/31: Cleared for discharge to SNF, agrees with blood transfusion, weightbearing as tolerated on right lower extremity, dry dressing change to surgical site, aspirin 325 MG daily for postop DVT prophylaxis (patient is already on this prior to admission) and outpatient follow-up with him next week. SNF -admitted for OT/PT; cont ASA as prophylaxis  Postoperative anemia due to acute blood loss Hemoglobin 13 on 3/27, dropped to low of 7.5 on 3/30. Blood transfusion was discussed at length with patient and son including risks and benefits. She contemplated this over the next 24 hours. Hemoglobin 7.4 on 3/31. She consented to blood transfusion and was transfused 1 unit of PRBCs. Posttransfusion hemoglobin 9.7. - Follow CBCs in a couple of days and aim to keep >8 g per DL. SNF - will f/u CBC  Diabetes mellitus without complication (HCC) SNF - controlled b y diet; A1c was 4.8  Alcohol dependency (HCC) Patient understates the amount of alcohol she consumes. Continue thiamine, folate and multivitamins. No overt withdrawal at this time. Abstinence or moderation counseled. SNF - plan cont thiamibe, folate and MVI  Hypomagnesemia Replace IV prior to DC; SNF.- f/u BMP + Mg+  Hyponatremia Corrected prior to Wang/c  Hypokalemia SNF - repelted prior to Wang/c ; will f/u BMP  Protein-calorie malnutrition, severe SNF - dietary to see;protein supplements will be given   Time spent > 45 min;> 50% of time with patient was spent reviewing records, labs, tests and studies, counseling and  developing plan of care  Margit Hanks, MD

## 2016-02-27 ENCOUNTER — Encounter: Payer: Self-pay | Admitting: Internal Medicine

## 2016-02-27 DIAGNOSIS — D62 Acute posthemorrhagic anemia: Secondary | ICD-10-CM | POA: Insufficient documentation

## 2016-02-27 DIAGNOSIS — E876 Hypokalemia: Secondary | ICD-10-CM | POA: Insufficient documentation

## 2016-02-27 NOTE — Assessment & Plan Note (Signed)
Replace IV prior to DC; SNF.- f/u BMP + Mg+

## 2016-02-27 NOTE — Assessment & Plan Note (Signed)
Patient understates the amount of alcohol she consumes. Continue thiamine, folate and multivitamins. No overt withdrawal at this time. Abstinence or moderation counseled. SNF - plan cont thiamibe, folate and MVI

## 2016-02-27 NOTE — Assessment & Plan Note (Signed)
SNF - repelted prior to d/c ; will f/u BMP

## 2016-02-27 NOTE — Assessment & Plan Note (Signed)
Hemoglobin 13 on 3/27, dropped to low of 7.5 on 3/30. Blood transfusion was discussed at length with patient and son including risks and benefits. She contemplated this over the next 24 hours. Hemoglobin 7.4 on 3/31. She consented to blood transfusion and was transfused 1 unit of PRBCs. Posttransfusion hemoglobin 9.7. - Follow CBCs in a couple of days and aim to keep >8 g per DL. SNF - will f/u CBC

## 2016-02-27 NOTE — Assessment & Plan Note (Signed)
SNF - dietary to see;protein supplements will be given

## 2016-02-27 NOTE — Assessment & Plan Note (Signed)
Sustained status post fall - Orthopedics was consulted and patient underwent surgical fixation by intramedullary nail on 02/16/16. Postoperatively patient was noted to be sedated and hence several of her medications were discontinued. - As discussed with Dr. Ophelia CharterYates, orthopedics on 3/31: Cleared for discharge to SNF, agrees with blood transfusion, weightbearing as tolerated on right lower extremity, dry dressing change to surgical site, aspirin 325 MG daily for postop DVT prophylaxis (patient is already on this prior to admission) and outpatient follow-up with him next week. SNF -admitted for OT/PT; cont ASA as prophylaxis

## 2016-02-27 NOTE — Assessment & Plan Note (Signed)
SNF - controlled b y diet; A1c was 4.8

## 2016-02-27 NOTE — Assessment & Plan Note (Signed)
Corrected prior to d/c

## 2016-03-02 ENCOUNTER — Encounter: Payer: Self-pay | Admitting: Internal Medicine

## 2016-03-02 ENCOUNTER — Non-Acute Institutional Stay (SKILLED_NURSING_FACILITY): Payer: Self-pay | Admitting: Internal Medicine

## 2016-03-02 DIAGNOSIS — F1020 Alcohol dependence, uncomplicated: Secondary | ICD-10-CM

## 2016-03-02 DIAGNOSIS — I10 Essential (primary) hypertension: Secondary | ICD-10-CM

## 2016-03-02 DIAGNOSIS — E119 Type 2 diabetes mellitus without complications: Secondary | ICD-10-CM

## 2016-03-02 DIAGNOSIS — E43 Unspecified severe protein-calorie malnutrition: Secondary | ICD-10-CM

## 2016-03-02 DIAGNOSIS — D62 Acute posthemorrhagic anemia: Secondary | ICD-10-CM

## 2016-03-02 DIAGNOSIS — E876 Hypokalemia: Secondary | ICD-10-CM

## 2016-03-02 DIAGNOSIS — E871 Hypo-osmolality and hyponatremia: Secondary | ICD-10-CM

## 2016-03-02 DIAGNOSIS — S72141D Displaced intertrochanteric fracture of right femur, subsequent encounter for closed fracture with routine healing: Secondary | ICD-10-CM

## 2016-03-02 NOTE — Progress Notes (Signed)
MRN: [id number] Name: Roland EarlDiane Kirchoff  Sex: female Age: 65 y.o. DOB: [birth year]  [name]:123 Level Of Care: SNF Provider: Merrilee SeashoreALEXANDER, ANNE D Emergency Contacts: Extended Emergency Contact Information Primary Emergency Contact: [facility]Barton,Trever Address: [address]          [place], [place] [id number] [place] Home Phone: [phone] Relation: Son Secondary Emergency Contact: Yvetta CoderBarton,Michelle  [place] Work Phone: [phone] Relation: Daughter  Code Status:   Allergies: Penicillins  Chief Complaint  Patient presents with  . Discharge Note    HPI: Patient is 65 y.o. female with history of diet-controlled DM, alcohol dependence, tobacco abuse, HTN, visiting [place] from [place], presented to the ED following a mechanical fall. She ambulates with the help of a walker and reports that she got her feet tangled up and fell landing on her right side. [facility] where she underwent a surgical fixation with intramedullary nail. Hospital course was complicated by blood loss, requiring transfusion and electrolyte abnormalities common to alcoholics. Pt was admitted to SNF for generalized weakness for OT/PT and is now ready to be d/c to home.  Past Medical History  Diagnosis Date  . Diabetes mellitus without complication (HCC)   . HTN (hypertension)   . OA (osteoarthritis)   . Pelvic fracture (HCC) 2015  . Alcohol dependency [facility](HCC)     Past Surgical History  Procedure Laterality Date  . Appendectomy    . Femur im nail Right 02/15/2016    Procedure: INTRAMEDULLARY (IM) NAIL FEMORAL;  Surgeon: Eldred MangesMark C Yates, MD;  Location: [facility];  Service: Orthopedics;  Laterality: Right;      Medication List       This list is accurate as of: 03/02/16  1:45 PM.  Always use your most recent med list.               aspirin 325 MG tablet  Take 325 mg by mouth daily.     calcium-vitamin D 500-200 MG-UNIT tablet  Commonly known as:   OSCAL WITH D  Take 1 tablet by mouth daily.     folic acid 1 MG tablet  Commonly known as:  FOLVITE  Take 1 tablet (1 mg total) by mouth daily.     magnesium oxide 400 MG tablet  Commonly known as:  MAG-OX  Take 400 mg by mouth daily.     Melatonin 3 MG Tabs  Take 1 tablet by mouth at bedtime.     NEXIUM 40 MG packet  Generic drug:  esomeprazole  Take 40 mg by mouth daily before breakfast.     oxyCODONE-acetaminophen 5-325 MG tablet  Commonly known as:  ROXICET  Take 1 tablet by mouth every 6 (six) hours as needed for severe pain.     prenatal multivitamin Tabs tablet  Take 1 tablet by mouth daily at 12 noon.     thiamine 100 MG tablet  Take 1 tablet (100 mg total) by mouth daily.        Meds ordered this encounter  Medications  . esomeprazole (NEXIUM) 40 MG packet    Sig: Take 40 mg by mouth daily before breakfast.  . magnesium oxide (MAG-OX) 400 MG tablet    Sig: Take 400 mg by mouth daily.  . Melatonin 3 MG TABS    Sig: Take 1 tablet by mouth at bedtime.     There is no immunization history on file for this patient.  Social History  Substance Use Topics  .  Smoking status: Current Every Day Smoker -- 0.50 packs/day for 25 years    Types: Cigarettes  . Smokeless tobacco: Not on file  . Alcohol Use: 0.0 oz/week    0 Standard drinks or equivalent per week     Comment: 2 beers a day, 2-3 mixed drinks at night.    Filed Vitals:   03/02/16 1323  BP: 142/94  Pulse: 88  Temp: 97 F (36.1 C)  Resp: 20    Physical Exam  GENERAL APPEARANCE: Alert, conversant. No acute distress.  HEENT: Unremarkable. RESPIRATORY: Breathing is even, unlabored. Lung sounds are clear   CARDIOVASCULAR: Heart RRR no murmurs, rubs or gallops. No peripheral edema.  GASTROINTESTINAL: Abdomen is soft, non-tender, not distended w/ normal bowel sounds.  NEUROLOGIC: Cranial nerves 2-12 grossly intact. Moves all extremities  Patient Active Problem List   Diagnosis Date Noted  .  Postoperative anemia due to acute blood loss 02/27/2016  . Hypomagnesemia 02/27/2016  . Hypokalemia 02/27/2016  . Acute blood loss anemia 02/18/2016  . Protein-calorie malnutrition, severe 02/15/2016  . Intertrochanteric fracture of femur (HCC) 02/14/2016  . Alcohol dependency (HCC) 02/14/2016  . Hyponatremia 02/14/2016  . Fall at home 02/14/2016  . Diet-controlled diabetes mellitus (HCC) 02/14/2016  . Diabetes mellitus without complication (HCC)   . HTN (hypertension)   . OA (osteoarthritis)   . Essential hypertension   . Arthritis, senescent     CBC    Component Value Date/Time   WBC 5.0 02/18/2016 0406   WBC 7.6 02/18/2006   RBC 2.15* 02/18/2016 0406   HGB 9.7* 02/18/2016 1835   HCT 26.9* 02/18/2016 1835   PLT 219 02/18/2016 0406   MCV 96.3 02/18/2016 0406   LYMPHSABS 0.7 02/14/2016 0515   MONOABS 0.6 02/14/2016 0515   EOSABS 0.1 02/14/2016 0515   BASOSABS 0.0 02/14/2016 0515    CMP     Component Value Date/Time   NA 132* 02/18/2016 0406   NA 127* 02/18/2006   K 3.7 02/18/2016 0406   CL 102 02/18/2016 0406   CO2 23 02/18/2016 0406   GLUCOSE 111* 02/18/2016 0406   BUN <5* 02/18/2016 0406   BUN 4 02/18/2006   CREATININE 0.49 02/18/2016 0406   CREATININE 0.4* 02/18/2006   CALCIUM 8.9 02/18/2016 0406   PROT 7.4 02/14/2016 0515   ALBUMIN 3.7 02/14/2016 0515   AST 48* 02/14/2016 0515   ALT 25 02/14/2016 0515   ALKPHOS 136* 02/14/2016 0515   BILITOT 0.8 02/14/2016 0515   GFRNONAA >60 02/18/2016 0406   GFRAA >60 02/18/2016 0406    Assessment and Plan  Pt is d/c to home with HH/OT/PT which she is going to refuse 2/2 money but there is reportedly a PT relative and relative who is a Engineer, civil (consulting). Medications have been reconcilled and Rx's written. \  Time spent > 30 min;> 50% of time with patient was spent reviewing records, labs, tests and studies, counseling and developing plan of care  Margit Hanks, MD

## 2016-03-10 DIAGNOSIS — K219 Gastro-esophageal reflux disease without esophagitis: Secondary | ICD-10-CM | POA: Insufficient documentation

## 2017-07-05 IMAGING — CR DG CHEST 1V
1 series · 1 of 1 positions shown · non-contrast
Comparison: None.

CLINICAL DATA: Fell in living room tonight.  RIGHT hip pain.

EXAM:
CHEST 1 VIEW

[t chest supine]
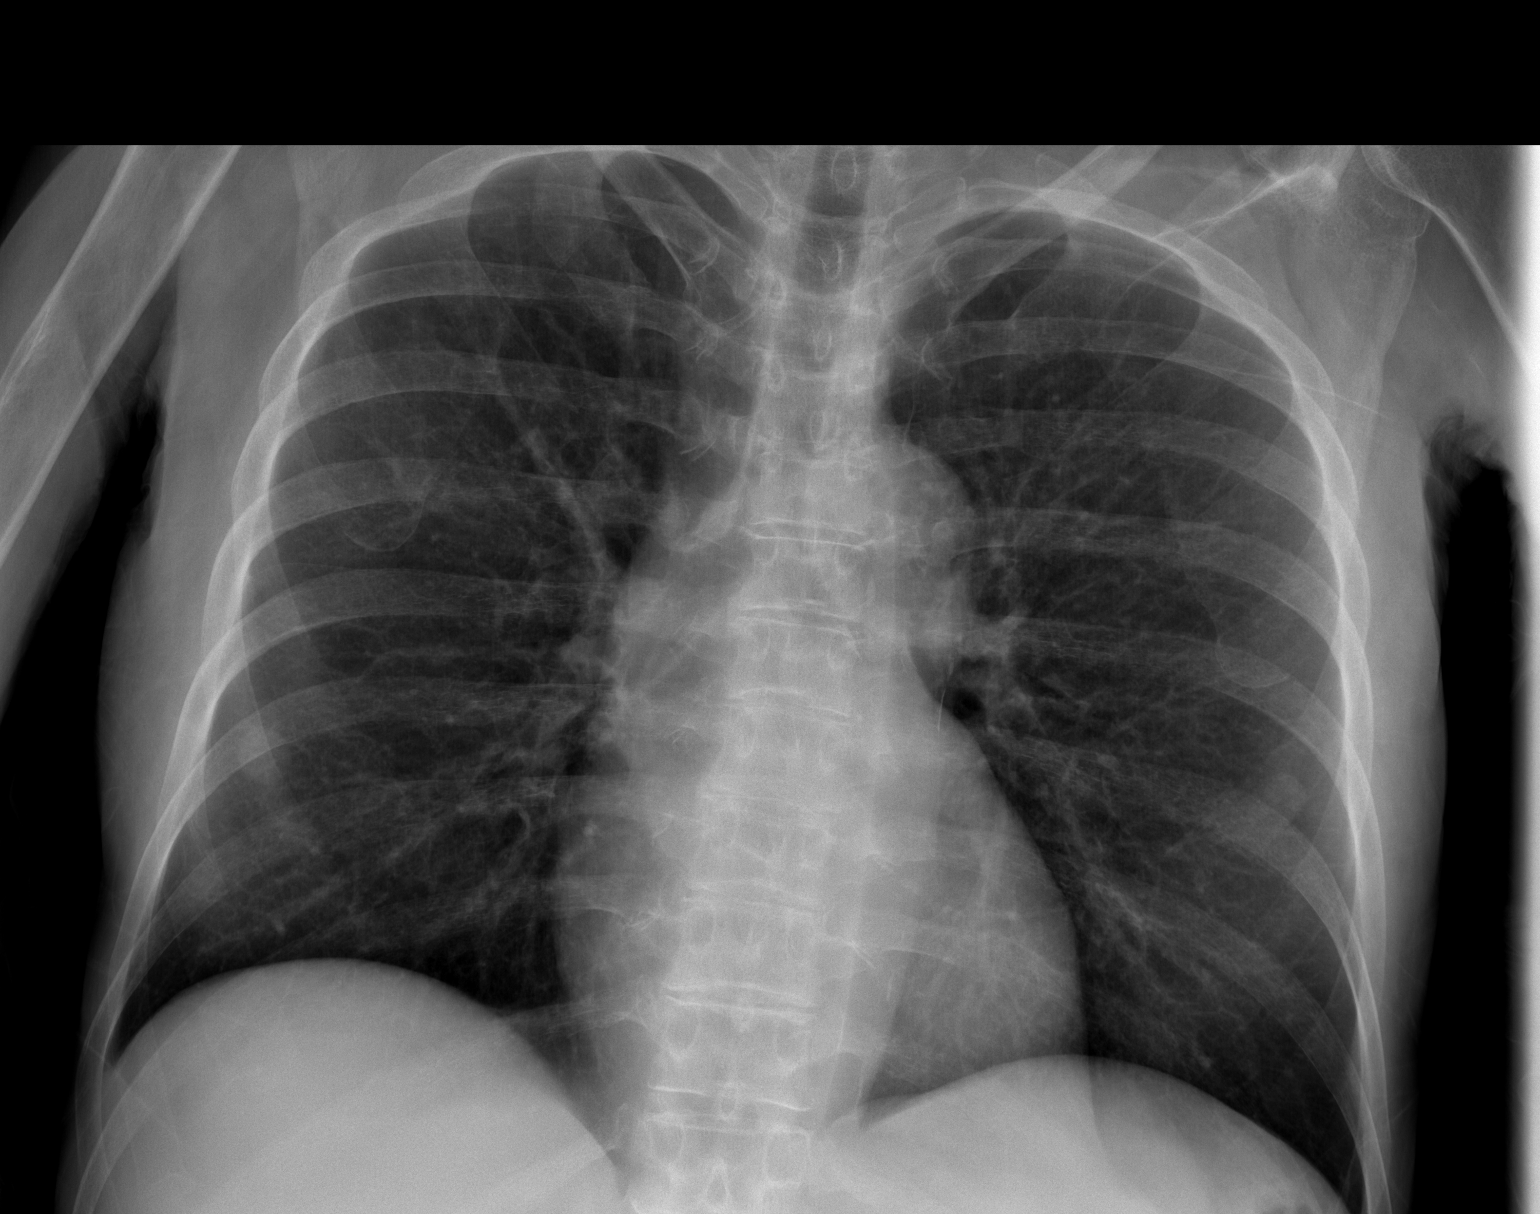

[1 of 1 positions shown; findings below may reference images not displayed]

FINDINGS: The heart size and mediastinal contours are within normal limits.
Mild calcific atherosclerosis of the descending aorta. Both lungs
are clear. The visualized skeletal structures are unremarkable.
IMPRESSION: No acute cardiopulmonary process.

## 2021-03-29 ENCOUNTER — Inpatient Hospital Stay (HOSPITAL_COMMUNITY): Payer: Medicare Other

## 2021-03-29 ENCOUNTER — Emergency Department (EMERGENCY_DEPARTMENT_HOSPITAL): Payer: Medicare Other

## 2021-03-29 ENCOUNTER — Other Ambulatory Visit: Payer: Self-pay

## 2021-03-29 ENCOUNTER — Inpatient Hospital Stay
Admission: AC | Admit: 2021-03-29 | Discharge: 2021-04-05 | DRG: 493 | Disposition: A | Discharge status: Skilled Nursing Facility | Payer: Medicare Other | Attending: Internal Medicine | Admitting: Internal Medicine

## 2021-03-29 ENCOUNTER — Inpatient Hospital Stay (HOSPITAL_COMMUNITY): Payer: Self-pay | Admitting: Internal Medicine

## 2021-03-29 ENCOUNTER — Emergency Department (HOSPITAL_COMMUNITY): Payer: Medicare Other

## 2021-03-29 DIAGNOSIS — I129 Hypertensive chronic kidney disease with stage 1 through stage 4 chronic kidney disease, or unspecified chronic kidney disease: Secondary | ICD-10-CM | POA: Diagnosis present

## 2021-03-29 DIAGNOSIS — M80862A Other osteoporosis with current pathological fracture, left lower leg, initial encounter for fracture: Principal | ICD-10-CM | POA: Diagnosis present

## 2021-03-29 DIAGNOSIS — S51012A Laceration without foreign body of left elbow, initial encounter: Secondary | ICD-10-CM

## 2021-03-29 DIAGNOSIS — R9431 Abnormal electrocardiogram [ECG] [EKG]: Secondary | ICD-10-CM

## 2021-03-29 DIAGNOSIS — W1830XA Fall on same level, unspecified, initial encounter: Secondary | ICD-10-CM | POA: Diagnosis present

## 2021-03-29 DIAGNOSIS — S82142A Displaced bicondylar fracture of left tibia, initial encounter for closed fracture: Secondary | ICD-10-CM

## 2021-03-29 DIAGNOSIS — Z7982 Long term (current) use of aspirin: Secondary | ICD-10-CM

## 2021-03-29 DIAGNOSIS — W25XXXA Contact with sharp glass, initial encounter: Secondary | ICD-10-CM | POA: Diagnosis present

## 2021-03-29 DIAGNOSIS — W19XXXS Unspecified fall, sequela: Secondary | ICD-10-CM

## 2021-03-29 DIAGNOSIS — M47812 Spondylosis without myelopathy or radiculopathy, cervical region: Secondary | ICD-10-CM | POA: Diagnosis present

## 2021-03-29 DIAGNOSIS — Z96642 Presence of left artificial hip joint: Secondary | ICD-10-CM

## 2021-03-29 DIAGNOSIS — N189 Chronic kidney disease, unspecified: Secondary | ICD-10-CM | POA: Diagnosis present

## 2021-03-29 DIAGNOSIS — S79922A Unspecified injury of left thigh, initial encounter: Secondary | ICD-10-CM

## 2021-03-29 DIAGNOSIS — Z79891 Long term (current) use of opiate analgesic: Secondary | ICD-10-CM

## 2021-03-29 DIAGNOSIS — Z87891 Personal history of nicotine dependence: Secondary | ICD-10-CM

## 2021-03-29 DIAGNOSIS — G8929 Other chronic pain: Secondary | ICD-10-CM | POA: Diagnosis present

## 2021-03-29 DIAGNOSIS — R339 Retention of urine, unspecified: Secondary | ICD-10-CM | POA: Diagnosis not present

## 2021-03-29 DIAGNOSIS — S99912A Unspecified injury of left ankle, initial encounter: Secondary | ICD-10-CM

## 2021-03-29 DIAGNOSIS — Z20822 Contact with and (suspected) exposure to covid-19: Secondary | ICD-10-CM | POA: Diagnosis present

## 2021-03-29 DIAGNOSIS — Z8262 Family history of osteoporosis: Secondary | ICD-10-CM

## 2021-03-29 DIAGNOSIS — D62 Acute posthemorrhagic anemia: Secondary | ICD-10-CM | POA: Diagnosis not present

## 2021-03-29 DIAGNOSIS — S82222A Displaced transverse fracture of shaft of left tibia, initial encounter for closed fracture: Secondary | ICD-10-CM

## 2021-03-29 DIAGNOSIS — N179 Acute kidney failure, unspecified: Secondary | ICD-10-CM | POA: Diagnosis present

## 2021-03-29 DIAGNOSIS — Z23 Encounter for immunization: Secondary | ICD-10-CM

## 2021-03-29 DIAGNOSIS — S098XXA Other specified injuries of head, initial encounter: Secondary | ICD-10-CM

## 2021-03-29 DIAGNOSIS — E871 Hypo-osmolality and hyponatremia: Secondary | ICD-10-CM | POA: Diagnosis present

## 2021-03-29 DIAGNOSIS — I219 Acute myocardial infarction, unspecified: Secondary | ICD-10-CM

## 2021-03-29 DIAGNOSIS — S82202A Unspecified fracture of shaft of left tibia, initial encounter for closed fracture: Secondary | ICD-10-CM

## 2021-03-29 DIAGNOSIS — I4891 Unspecified atrial fibrillation: Secondary | ICD-10-CM | POA: Diagnosis present

## 2021-03-29 DIAGNOSIS — S1989XA Other specified injuries of other specified part of neck, initial encounter: Secondary | ICD-10-CM

## 2021-03-29 DIAGNOSIS — G629 Polyneuropathy, unspecified: Secondary | ICD-10-CM | POA: Diagnosis present

## 2021-03-29 DIAGNOSIS — D539 Nutritional anemia, unspecified: Secondary | ICD-10-CM | POA: Diagnosis present

## 2021-03-29 DIAGNOSIS — R918 Other nonspecific abnormal finding of lung field: Secondary | ICD-10-CM

## 2021-03-29 LAB — CBC, DIFF
% Basophils: 1 %
% Eosinophils: 4 %
% Immature Granulocytes: 0 %
% Lymphocytes: 21 %
% Monocytes: 9 %
% Neutrophils: 65 %
% Nucleated RBC: 0 %
Absolute Eosinophil Count: 0.21 10*3/uL (ref 0.00–0.50)
Absolute Lymphocyte Count: 1.19 10*3/uL (ref 1.00–4.80)
Basophils: 0.04 10*3/uL (ref 0.00–0.20)
Hematocrit: 36 % (ref 36.0–45.0)
Hemoglobin: 12 g/dL (ref 11.5–15.5)
Immature Granulocytes: 0.02 10*3/uL (ref 0.00–0.05)
MCH: 34.7 pg — ABNORMAL HIGH (ref 27.3–33.6)
MCHC: 33.1 g/dL (ref 32.2–36.5)
MCV: 105 fL — ABNORMAL HIGH (ref 81–98)
Monocytes: 0.52 10*3/uL (ref 0.00–0.80)
Neutrophils: 3.77 10*3/uL (ref 1.80–7.00)
Nucleated RBC: 0 10*3/uL
Platelet Count: 212 10*3/uL (ref 150–400)
RBC: 3.46 10*6/uL — ABNORMAL LOW (ref 3.80–5.00)
RDW-CV: 14.3 % (ref 11.6–14.4)
WBC: 5.75 10*3/uL (ref 4.3–10.0)

## 2021-03-29 LAB — SARS-COV-2 (COVID-19) QUALITATIVE RAPID PCR: COVID-19 Coronavirus Qual PCR Result: NOT DETECTED

## 2021-03-29 LAB — COMPREHENSIVE METABOLIC PANEL
ALT (GPT): 21 U/L (ref 7–33)
AST (GOT): 37 U/L (ref 9–38)
Albumin: 3.9 g/dL (ref 3.5–5.2)
Alkaline Phosphatase (Total): 115 U/L (ref 38–172)
Anion Gap: 9 (ref 4–12)
Bilirubin (Total): 0.8 mg/dL (ref 0.2–1.3)
Calcium: 10.2 mg/dL (ref 8.9–10.2)
Carbon Dioxide, Total: 24 meq/L (ref 22–32)
Chloride: 99 meq/L (ref 98–108)
Creatinine: 1.18 mg/dL — ABNORMAL HIGH (ref 0.38–1.02)
Glucose: 119 mg/dL (ref 62–125)
Potassium: 4.9 meq/L (ref 3.6–5.2)
Protein (Total): 6.7 g/dL (ref 6.0–8.2)
Sodium: 132 meq/L — ABNORMAL LOW (ref 135–145)
Urea Nitrogen: 28 mg/dL — ABNORMAL HIGH (ref 8–21)
eGFR by CKD-EPI: 47 mL/min/{1.73_m2} — ABNORMAL LOW (ref >59)

## 2021-03-29 LAB — TROPONIN_I
Troponin_I Interpretation: NORMAL
Troponin_I: <0.03 ng/mL (ref <0.04)

## 2021-03-29 LAB — PROTHROMBIN TIME
Prothrombin INR: 1 (ref 0.8–1.3)
Prothrombin Time Patient: 13.3 s (ref 10.7–15.6)

## 2021-03-29 LAB — ALCOHOL (ETHYL): Alcohol (Ethyl): NEGATIVE mg/dL

## 2021-03-29 LAB — 1ST EXTRA ORANGE TOP

## 2021-03-29 LAB — LAB ADD ON ORDER

## 2021-03-29 LAB — PARTIAL THROMBOPLASTIN TIME: Partial Thromboplastin Time: 31 s (ref 22–35)

## 2021-03-29 MED ORDER — MULTIVITAMIN-MINERALS OR TAB
1.0000 | ORAL_TABLET | Freq: Every day | ORAL | Status: DC
Start: 2021-03-30 — End: 2021-04-05
  Administered 2021-03-30 – 2021-04-05 (×7): 1 via ORAL
  Filled 2021-03-29 (×7): qty 1

## 2021-03-29 MED ORDER — ACETAMINOPHEN 325 MG OR TABS
650.0000 mg | ORAL_TABLET | ORAL | Status: DC | PRN
Start: 2021-03-29 — End: 2021-04-05
  Administered 2021-03-29: 650 mg via ORAL
  Filled 2021-03-29 (×2): qty 2

## 2021-03-29 MED ORDER — METOPROLOL TARTRATE 50 MG OR TABS
50.0000 mg | ORAL_TABLET | Freq: Two times a day (BID) | ORAL | Status: DC
Start: 2021-03-29 — End: 2021-04-05
  Administered 2021-03-29 – 2021-04-05 (×12): 50 mg via ORAL
  Filled 2021-03-29 (×14): qty 1

## 2021-03-29 MED ORDER — ASPIRIN 81 MG OR TBEC
81.0000 mg | DELAYED_RELEASE_TABLET | Freq: Every day | ORAL | Status: DC
Start: 2021-03-30 — End: 2021-04-03
  Administered 2021-03-30 – 2021-04-03 (×5): 81 mg via ORAL
  Filled 2021-03-29 (×5): qty 1

## 2021-03-29 MED ORDER — CHOLECALCIFEROL 25 MCG (1000 UT) OR TABS
2000.0000 [IU] | ORAL_TABLET | Freq: Every day | ORAL | Status: DC
Start: 2021-03-30 — End: 2021-04-05
  Administered 2021-03-30 – 2021-04-05 (×7): 2000 [IU] via ORAL
  Filled 2021-03-29 (×7): qty 2

## 2021-03-29 MED ORDER — ACETAMINOPHEN 500 MG OR TABS
1000.0000 mg | ORAL_TABLET | Freq: Once | ORAL | Status: AC
Start: 2021-03-29 — End: 2021-03-29
  Administered 2021-03-29: 1000 mg via ORAL
  Filled 2021-03-29: qty 2

## 2021-03-29 MED ORDER — PRENATAL VITAMINS 1 MG FA OR TABS (WRAPPER)
1.0000 | ORAL_TABLET | Freq: Every day | ORAL | Status: DC
Start: 2021-03-30 — End: 2021-04-05
  Administered 2021-03-30 – 2021-04-05 (×7): 1 via ORAL
  Filled 2021-03-29 (×7): qty 1

## 2021-03-29 MED ORDER — VITAMIN B COMPLEX OR TABS
1.0000 | ORAL_TABLET | Freq: Every day | ORAL | Status: DC
Start: 2021-03-30 — End: 2021-04-05
  Administered 2021-03-30 – 2021-04-05 (×7): 1 via ORAL
  Filled 2021-03-29 (×7): qty 1

## 2021-03-29 MED ORDER — OXYCODONE HCL 5 MG OR TABS
5.0000 mg | ORAL_TABLET | Freq: Four times a day (QID) | ORAL | Status: DC
Start: 2021-03-30 — End: 2021-04-05
  Administered 2021-03-29 – 2021-04-05 (×26): 5 mg via ORAL
  Filled 2021-03-29 (×28): qty 1

## 2021-03-29 MED ORDER — ACETAMINOPHEN 500 MG OR TABS
1000.0000 mg | ORAL_TABLET | Freq: Three times a day (TID) | ORAL | Status: DC
Start: 2021-03-30 — End: 2021-04-05
  Administered 2021-03-30 – 2021-04-05 (×18): 1000 mg via ORAL
  Filled 2021-03-29 (×18): qty 2

## 2021-03-29 MED ORDER — ENOXAPARIN SODIUM 30 MG/0.3ML IJ SOSY
30.0000 mg | PREFILLED_SYRINGE | Freq: Every evening | INTRAMUSCULAR | Status: DC
Start: 2021-03-29 — End: 2021-03-30
  Administered 2021-03-29: 30 mg via SUBCUTANEOUS
  Filled 2021-03-29: qty 0.3

## 2021-03-29 MED ORDER — COVID-19 MRNA MONOVALENT VACCINE (PFIZER) 30 MCG/0.3ML IM (WRAPPER)
30.0000 ug | Freq: Every day | INTRAMUSCULAR | Status: AC
Start: 2021-03-30 — End: 2021-03-30
  Administered 2021-03-30: 30 ug via INTRAMUSCULAR
  Filled 2021-03-29: qty 0.3

## 2021-03-29 MED ORDER — HYDROMORPHONE HCL 1 MG/ML IJ SOLN
0.5000 mg | INTRAMUSCULAR | Status: DC | PRN
Start: 2021-03-29 — End: 2021-04-02
  Administered 2021-03-29 – 2021-03-30 (×3): 1 mg via INTRAVENOUS
  Filled 2021-03-29 (×3): qty 1

## 2021-03-29 MED ORDER — OXYCODONE HCL 5 MG OR TABS
5.0000 mg | ORAL_TABLET | ORAL | Status: DC | PRN
Start: 2021-03-29 — End: 2021-04-05
  Administered 2021-03-29 – 2021-04-05 (×23): 5 mg via ORAL
  Filled 2021-03-29 (×21): qty 1

## 2021-03-29 MED ORDER — DULOXETINE HCL 30 MG OR CPEP
30.0000 mg | DELAYED_RELEASE_CAPSULE | Freq: Every day | ORAL | Status: DC
Start: 2021-03-30 — End: 2021-04-05
  Administered 2021-03-30 – 2021-04-05 (×7): 30 mg via ORAL
  Filled 2021-03-29 (×7): qty 1

## 2021-03-29 MED ORDER — MELATONIN 3 MG OR TABS
3.0000 mg | ORAL_TABLET | Freq: Every evening | ORAL | Status: DC | PRN
Start: 2021-03-29 — End: 2021-04-05
  Administered 2021-03-29 – 2021-04-04 (×4): 3 mg via ORAL
  Filled 2021-03-29 (×4): qty 1

## 2021-03-29 MED ORDER — ONDANSETRON HCL 4 MG/2ML IJ SOLN
4.0000 mg | Freq: Once | INTRAMUSCULAR | Status: AC
Start: 2021-03-29 — End: 2021-03-29
  Administered 2021-03-29: 4 mg via INTRAVENOUS
  Filled 2021-03-29: qty 2

## 2021-03-29 MED ORDER — ONDANSETRON HCL 4 MG OR TABS
4.0000 mg | ORAL_TABLET | Freq: Three times a day (TID) | ORAL | Status: DC | PRN
Start: 2021-03-29 — End: 2021-04-05

## 2021-03-29 MED ORDER — KETOROLAC TROMETHAMINE 30 MG/ML IJ SOLN
15.0000 mg | Freq: Once | INTRAMUSCULAR | Status: AC
Start: 2021-03-29 — End: 2021-03-29
  Administered 2021-03-29: 15 mg via INTRAVENOUS
  Filled 2021-03-29: qty 1

## 2021-03-29 MED ORDER — QUETIAPINE FUMARATE 25 MG OR TABS
25.0000 mg | ORAL_TABLET | Freq: Two times a day (BID) | ORAL | Status: DC
Start: 2021-03-29 — End: 2021-04-05
  Administered 2021-03-29 – 2021-04-05 (×14): 25 mg via ORAL
  Filled 2021-03-29 (×15): qty 1

## 2021-03-29 MED ORDER — ONDANSETRON HCL 4 MG/2ML IJ SOLN
4.0000 mg | Freq: Three times a day (TID) | INTRAMUSCULAR | Status: DC | PRN
Start: 2021-03-29 — End: 2021-04-05

## 2021-03-29 MED ORDER — SENNOSIDES 8.6 MG OR TABS
17.2000 mg | ORAL_TABLET | Freq: Two times a day (BID) | ORAL | Status: DC | PRN
Start: 2021-03-29 — End: 2021-04-01

## 2021-03-29 MED ORDER — FERROUS SULFATE 324 (65 FE) MG OR TBEC
324.0000 mg | DELAYED_RELEASE_TABLET | Freq: Every day | ORAL | Status: DC
Start: 2021-03-30 — End: 2021-04-05
  Administered 2021-03-30 – 2021-04-05 (×7): 324 mg via ORAL
  Filled 2021-03-29 (×7): qty 1

## 2021-03-29 MED ORDER — OXYCODONE HCL 5 MG OR TABS
5.0000 mg | ORAL_TABLET | Freq: Once | ORAL | Status: AC
Start: 2021-03-29 — End: 2021-03-29
  Administered 2021-03-29: 5 mg via ORAL
  Filled 2021-03-29: qty 1

## 2021-03-29 MED ORDER — MORPHINE SULFATE (PF) 2 MG/ML IV/IJ SOLN WRAPPER
2.0000 mg | Status: AC | PRN
Start: 2021-03-29 — End: 2021-03-29
  Administered 2021-03-29 (×2): 4 mg via INTRAVENOUS
  Administered 2021-03-29: 2 mg via INTRAVENOUS
  Filled 2021-03-29: qty 2
  Filled 2021-03-29: qty 1
  Filled 2021-03-29: qty 2

## 2021-03-29 MED ORDER — CALCIUM CARBONATE ANTACID 500 MG OR CHEW
500.0000 mg | CHEWABLE_TABLET | Freq: Every day | ORAL | Status: DC
Start: 2021-03-30 — End: 2021-04-05
  Administered 2021-03-30 – 2021-04-05 (×7): 500 mg via ORAL
  Filled 2021-03-29 (×7): qty 1

## 2021-03-29 NOTE — Progress Notes (Signed)
ORTHOPAEDIC SURGERY COMPARTMENT CHECK      I evaluated the patient at 2345 hours while maintaining a particular focus on the compartments of the LEFT lower extremity given the nature of the injury. Upon evaluation, the patient appeared to be resting in bed, eating a sandwich and watching television, demonstrating no signs of severe discomfort.  Patient reports that her pain has improved since her previous evaluation in the ED.  The affected extremity is in a long leg splint with an anterior window cut proximally over area of maximal swelling to facilitate compartment checks. The proximal anterior and lateral compartments are swollen and tight, although somewhat more compressible than on previous examination. Feeling distally down under the splint, the distal anterior and lateral compartments are soft and easily compressible. In terms of the sensory exam, sensation is diminished to light touch in the deep and superficial peroneal nerve distributions, patient reports her sensation is stable since previous examination at 2000 in the ED. Her sensation is intact to light touch in the sural, saphenous, and tibial nerve distributions. Regarding the motor examination, the patient demonstrates intact motor function of the EHL and FHL against gravity. There is no discomfort with passive motion of the great toe. The digits are warm and capillary refill <2s.    I do not suspect the development of complications at this time, but a member of the Orthopaedic Surgery team will return to monitor the lower extremity at regular intervals throughout the night or sooner if issues arise.      Merilyn Baba, MD, PGY3  [facility]  [facility]

## 2021-03-29 NOTE — ED Provider Notes (Signed)
This is a 70 year old year-old female presenting for evaluation of   [place]   Chief Complaint   Patient presents with    [place]   Alyson Lakatos is a 70 year old female who presents with left leg pain and head trauma after mechanical fall    History is provided by: The patient    Onset: Just prior to arrival    Quality: Sharp    Severity: Severe    Location/Radiation: Left lower leg    Aggravated by: Movement    Improved by: Immobilization    Associated symptoms: Scalp abrasion are present; the patient denies loss of consciousness    Patient is a 70 year old female who presents after a fall.  Patient was walking down stairs when she tripped over the last 3 stairs landing on her left side.  She struck her head.  She had immediate pain to her left lower leg and was unable to ambulate.  Denies trauma to chest or abdominal wall.       PAST MEDICAL AND SURGICAL HISTORY       Atrial fibrillation  Chronic pain on continuous opiates  Multiple orthopedic injuries     SOCIAL HISTORY    Lives in [place], visiting family in [place].     PAST FAMILY HISTORY          MEDICATIONS   Current Facility-Administered Medications   Medication Dose Route Frequency Provider Last Rate Last Admin    acetaminophen (Tylenol) tablet 1,000 mg  1,000 mg Oral TID Fortino Sic, MD   1,000 mg at 03/30/21 4627    acetaminophen (Tylenol) tablet 650 mg  650 mg Oral q4h PRN Fortino Sic, MD   650 mg at 03/29/21 2256    aspirin EC tablet 81 mg  81 mg Oral Daily Fortino Sic, MD   81 mg at 03/30/21 0350    calcium carbonate (Tums) chewable tablet 500 mg  500 mg Oral Daily with breakfast Fortino Sic, MD   500 mg at 03/30/21 0844    cholecalciferol (Vitamin D-3) tablet 2,000 units  2,000 units Oral Daily Fortino Sic, MD   2,000 units at 03/30/21 0844    COVID-19 mRNA vaccine (Pfizer) injection 30 mcg  30 mcg Intramuscular Daily with lunch Fortino Sic, MD        DULoxetine  (Cymbalta) DR capsule 30 mg  30 mg Oral Daily Fortino Sic, MD   30 mg at 03/30/21 0938    ferrous sulfate EC tablet 324 mg  324 mg Oral Daily Fortino Sic, MD   324 mg at 03/30/21 0844    heparin injection 5,000 units  5,000 units Subcutaneous q8h [facility] Fortino Sic, MD   5,000 units at 03/30/21 1829    HYDROmorphone (Dilaudid) injection 0.5-1 mg  0.5-1 mg Intravenous q4h PRN Fortino Sic, MD   1 mg at 03/30/21 0957    melatonin tablet 3 mg  3 mg Oral q HS PRN Fortino Sic, MD   3 mg at 03/29/21 2256    metoprolol tartrate (Lopressor) tablet 50 mg  50 mg Oral BID Fortino Sic, MD   50 mg at 03/29/21 2256    multivitamin with minerals tablet 1 tablet  1 tablet Oral Daily Fortino Sic, MD   1 tablet at 03/30/21 0843    ondansetron (Zofran) injection 4 mg  4 mg Intravenous q8h PRN Fortino Sic, MD  ondansetron (Zofran) tablet 4 mg  4 mg Oral q8h PRN Fortino Sic, MD        oxyCODONE tablet 5 mg  5 mg Oral QID Fortino Sic, MD   5 mg at 03/30/21 0623    oxyCODONE tablet 5 mg  5 mg Oral q4h PRN Fortino Sic, MD   5 mg at 03/30/21 7628    PRENATAL multivitamin w/folic acid 1 mg tablet  1 tablet Oral Daily Fortino Sic, MD   1 tablet at 03/30/21 0844    QUEtiapine (SEROquel) tablet 25 mg  25 mg Oral BID Fortino Sic, MD   25 mg at 03/30/21 3151    senna (Senokot) tablet 17.2 mg  17.2 mg Oral BID PRN Fortino Sic, MD        Vitamin B complex tablet 1 tablet  1 tablet Oral Daily Fortino Sic, MD   1 tablet at 03/30/21 7616        ALLERGIES   Review of patient's allergies indicates:  Allergies   Allergen Reactions    Penicillin V Skin: Rash    Skin Adhesives [Cyanoacrylate] Skin: Rash          REVIEW OF SYSTEMS   Review of Systems   Constitutional: Negative for fever and chills.   HENT: Negative for trouble swallowing and drooling.    Eyes: Negative for pain and discharge.   Respiratory: Negative for shortness of breath and apnea.     Cardiovascular: Negative for chest pain.   Gastrointestinal: Negative for abdominal pain and vomiting.   Genitourinary: Negative for frequency and hematuria.   Musculoskeletal: Negative for back pain and neck pain.   Skin: Negative for rash and color change.   Neurological: Negative for weakness and speech difficulty.   Hematological: Negative for adenopathy. Does not bruise/bleed easily.              Nursing notes reviewed.  Agree with nursing records except where documented differently by me.     PHYSICAL EXAM   ED VITALS:     Vitals (Arrival)      T: 36.5 C (03/29/21 1634)  BP: (!) 131/94 (03/29/21 1634)  HR: 64 (03/29/21 1634)  RR: 18 (03/29/21 1634)  SpO2: 97 % (03/29/21 1634) Room air   Vitals (Most recent in last 24 hrs)   T: 36.6 C (03/30/21 0826)  BP: (!) 91/55 (03/30/21 0826)  HR: 78 (03/30/21 0826)  RR: 19 (03/30/21 0826)  SpO2: 100 % (03/30/21 0826) Room air  T range: Temp  Min: 36.5 C  Max: 36.6 C  Wt 110 lb 14.3 oz (50.3 kg)     Ht _0  (1.6 m)     Body mass index is 19.64 kg/m.       Physical Exam  Vitals and nursing note reviewed.   Constitutional:       Comments: Adult female, sitting in stretcher, uncomfortable appearing   HENT:      Head: Normocephalic.      Comments: Abrasion with skin tear to left forehead     Nose: Nose normal.   Eyes:      Extraocular Movements: Extraocular movements intact.      Pupils: Pupils are equal, round, and reactive to light.   Neck:      Musculoskeletal: Normal range of motion and neck supple.   Cardiovascular:      Rate and Rhythm: Normal rate and regular rhythm.   Pulmonary:      Effort: Pulmonary effort is normal.  Breath sounds: Normal breath sounds.   Chest:      Chest wall: No tenderness.   Abdominal:      General: Abdomen is flat.      Palpations: Abdomen is soft.      Tenderness: There is no abdominal tenderness. There is no guarding or rebound.   Musculoskeletal:         General: Deformity present.      Cervical back: Normal range of motion and  neck supple.      Comments: Left lower extremity: Deformity noted tibial plateau with significant soft tissue swelling.  Compartments of the calf currently soft.  Palpable dorsalis pedis pulse.  Able to move toes and ankles.  Intact distal sensation.   Skin:     General: Skin is warm and dry.   Neurological:      General: No focal deficit present.      Mental Status: She is alert and oriented to person, place, and time.           LABORATORY:   Labs Reviewed   CBC, DIFF - Abnormal       Result Value    WBC 5.75      RBC 3.46 (*)     Hemoglobin 12.0      Hematocrit 36      MCV 105 (*)     MCH 34.7 (*)     MCHC 33.1      Platelet Count 212      RDW-CV 14.3      % Neutrophils 65      % Lymphocytes 21      % Monocytes 9      % Eosinophils 4      % Basophils 1      % Immature Granulocytes 0      Neutrophils 3.77      Absolute Lymphocyte Count 1.19      Monocytes 0.52      Absolute Eosinophil Count 0.21      Basophils 0.04      Immature Granulocytes 0.02      Nucleated RBC 0.00      % Nucleated RBC 0     COMPREHENSIVE METABOLIC PANEL - Abnormal    Sodium 132 (*)     Potassium 4.9      Chloride 99      Carbon Dioxide, Total 24      Anion Gap 9      Glucose 119      Urea Nitrogen 28 (*)     Creatinine 1.18 (*)     Protein (Total) 6.7      Albumin 3.9      Bilirubin (Total) 0.8      Calcium 10.2      AST (GOT) 37      Alkaline Phosphatase (Total) 115      ALT (GPT) 21      eGFR by CKD-EPI 47 (*)    BASIC METABOLIC PANEL - Abnormal    Sodium 131 (*)     Potassium 4.9      Chloride 101      Carbon Dioxide, Total 23      Anion Gap 7      Glucose 104      Urea Nitrogen 34 (*)     Creatinine 1.77 (*)     Calcium 9.2      eGFR by CKD-EPI 29 (*)    CBC (HEMOGRAM) - Abnormal    WBC 5.76  RBC 2.86 (*)     Hemoglobin 9.8 (*)     Hematocrit 31 (*)     MCV 107 (*)     MCH 34.3 (*)     MCHC 31.9 (*)     Platelet Count 190      RDW-CV 14.3     PROTHROMBIN TIME    Prothrombin Time Patient 13.3      Prothrombin INR 1.0     PARTIAL  THROMBOPLASTIN TIME    Partial Thromboplastin Time 31      Partial Thromboplastin X Mean        Value: To calculate the PTT X Mean divide PTT value by 29.   SARS-COV-2 (COVID-19) QUALITATIVE RAPID PCR    COVID-19 Coronavirus Qual PCR Specimen Type Nasal swab      COVID-19 Coronavirus Qual PCR Result None detected      COVID-19 Coronavirus Qual PCR Interpretation        Value: This is a negative result. Laboratory testing alone cannot rule out infection, particularly in the presence of clinical risk factors such as symptoms or exposure history.    COVID-19 Qualitative PCR Indication Admission Surveillance     TROPONIN_I    Troponin_I <0.03      Troponin_I Interpretation Normal     LAB ADD ON ORDER    Lab Test Requested alcohol level      Specimen Type/Description Blood      Sample To Use Most recent      Test Request Status Order Processed     ALCOHOL (ETHYL)    Alcohol (Ethyl) Negative     URINALYSIS WITH REFLEX CULTURE   VITAMIN D (25 HYDROXY)         IMAGING:     ED Wet Read -   XR Elbow 2 Vw Left   Final Result      Status post screw fixation through the olecranon of the ulna. No evidence of hardware complication.       Normal alignment. Trace elbow joint effusion. No acute fracture detected.      I have personally reviewed the images and agree with the report (or as edited).      XR Chest 1 Vw   Final Result   Heart size is normal. The aorta is calcified indicating atherosclerosis.      Lung volumes are slightly low, but the lungs are clear.      No pleural effusion. No pneumothorax.      No acute fracture detected.      I have personally reviewed the images and agree with the report (or as edited).      CT Knee wo Contrast Left   Final Result   FINDINGS/IMPRESSION:   1. Moderate lipohemarthrosis.   2. Diffuse demineralization of the bones.   3. Schatzker 6 tibial plateau fracture, with transverse component through the tibial diaphysis and longitudinal component extending into the tibial spine and the medial  tibial plateau.   4. Old distal femur fracture with intact lateral screw-plate construct as well as screw traversing the lateral femoral condyle.      _0        XR Ankle 3+ Vw Left   Final Result   No fracture detected.  Alignment preserved. Diffusely decreased bone mineral density.  No soft tissue abnormality.        XR Knee 1-2 Vw Left   Final Result   Bones are demineralized.      Acute comminuted transverse fracture through the proximal  tibial diaphysis with longitudinal sagittal extension into the medial tibial plateau and tibial spine. Appearance consistent with Schatzker 6 tibial plateau fracture. Small lipohemarthrosis of the left knee.      Old healed distal left femur fracture with lateral screw-plate construct.         XR Tibia Fibula 2 Vw Left   Final Result   Bones are demineralized.      Acute comminuted transverse fracture through the proximal tibial diaphysis with longitudinal sagittal extension into the medial tibial plateau and tibial spine. Appearance consistent with Schatzker 6 tibial plateau fracture. Small lipohemarthrosis of the left knee.      No fracture of the mid or distal tibia. No fibula fracture.      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 Left mid and distal femur screw-plate construct with old healed distal left femur fracture.      Diffuse demineralization. No hardware fracture. No acute femur fracture deformity seen.      CT Head wo Contrast   Final Result      No acute intracranial abnormalities. No acute calvarial fracture.      This is a preliminary report dictated by Sande Rives, M.D., PGY-3.  If the attending radiologist's final report does not appear below, the report is not yet finalized, as an attending radiologist has not yet reviewed the images.          ATTENDING FINAL REVIEW      I agree with the preliminary report.                       I have personally reviewed the images and agree with the report (or as edited).      CT C Spine  wo Contrast   Final Result    IMPRESSION:   There is grade 2 anterolisthesis of C2 on C3 in the setting of severe cervical spondylosis. No prevertebral soft tissue swelling. This finding could be chronic and/or degenerative in etiology. However given the lack of comparison exams and in the setting of acute trauma, if there is high clinical concern for an acute ligamentous injury of the cervical spine, MRI would be more useful for further evaluation.      No acute fracture of the cervical spine.      These findings were communicated to Dr. Earlie Server by Dr. Eber Jones at 6:17 PM on 03/29/2021.      I have personally reviewed the images and agree with the report (or as edited).          Radiology Final Result -   No image results found.            EKG   EKG: Atrial fibrillation at a rate of 68, T wave inversions in inferolateral leads, no comparisons.        TRAUMA/STROKE/STEMI ACTIVATION    Modified Adult Trauma Activation             SUICIDE RISK EVALUATION                 SEPSIS               ED COURSE/MEDICAL DECISION MAKING   Medications   acetaminophen (Tylenol) tablet 650 mg (650 mg Oral Given 03/29/21 2256)   multivitamin with minerals tablet 1 tablet (1 tablet Oral Given 03/30/21 0843)   melatonin tablet 3 mg (3 mg Oral Given 03/29/21 2256)  senna (Senokot) tablet 17.2 mg (has no administration in time range)   ondansetron (Zofran) tablet 4 mg (has no administration in time range)   ondansetron (Zofran) injection 4 mg (has no administration in time range)   aspirin EC tablet 81 mg (81 mg Oral Given 03/30/21 0843)   Vitamin B complex tablet 1 tablet (1 tablet Oral Given 03/30/21 0843)   DULoxetine (Cymbalta) DR capsule 30 mg (30 mg Oral Given 03/30/21 0843)   ferrous sulfate EC tablet 324 mg (324 mg Oral Given 03/30/21 0844)   metoprolol tartrate (Lopressor) tablet 50 mg (50 mg Oral Not Given 03/30/21 0846)   PRENATAL multivitamin w/folic acid 1 mg tablet (1 tablet Oral Given 03/30/21 0844)   QUEtiapine (SEROquel) tablet 25  mg (25 mg Oral Given 03/30/21 0844)   calcium carbonate (Tums) chewable tablet 500 mg (500 mg Oral Given 03/30/21 0844)   cholecalciferol (Vitamin D-3) tablet 2,000 units (2,000 units Oral Given 03/30/21 0844)   acetaminophen (Tylenol) tablet 1,000 mg (1,000 mg Oral Given 03/30/21 0516)   oxyCODONE tablet 5 mg (5 mg Oral Given 03/30/21 0516)   HYDROmorphone (Dilaudid) injection 0.5-1 mg (1 mg Intravenous Given 03/30/21 0957)   COVID-19 mRNA vaccine (Pfizer) injection 30 mcg (has no administration in time range)   oxyCODONE tablet 5 mg (5 mg Oral Given 03/30/21 0956)   heparin injection 5,000 units (5,000 units Subcutaneous Given 03/30/21 0958)   morphine PF injection 2-4 mg (4 mg Intravenous Given 03/29/21 2013)   ondansetron (Zofran) injection 4 mg (4 mg Intravenous Given 03/29/21 1652)   acetaminophen (Tylenol) tablet 1,000 mg (1,000 mg Oral Given 03/29/21 2130)   oxyCODONE tablet 5 mg (5 mg Oral Given 03/29/21 2130)   ketorolac (Toradol) injection 15 mg (15 mg Intravenous Given 03/29/21 2256)   lactated ringers IV Bolus 500 mL (500 mL Intravenous New Bag 03/30/21 0645)   lactated ringers IV Bolus 500 mL (500 mL Intravenous New Bag 03/30/21 0857)            Patient is 70 year old female presents with left leg pain and head trauma after mechanical fall down 3 stairs.  On evaluation she has obvious abrasions to her head as well as deformity to left proximal tibia.  There is significant soft tissue swelling to the proximal tibia although patient on initial presentation is intact neurovascularly distally.  We will plan on obtaining labs and imaging.    CT head and cervical spine ordered reviewed: No acute intracranial injury, CT cervical spine with likely severe cervical spondylosis.    X-ray of the left knee, tib-fib, femur ordered and reviewed: Comminuted transverse fracture through proximal tibial diaphysis with extension into the medial tibial plateau    Patient treated with multiple doses of IV morphine with good pain  relief.    Case discussed with orthopedics.  Patient evaluated orthopedic resident.  While in emergency department patient swelling did increase and she developed mild sensory deficit to the dorsum of the foot.  Patient felt not to be suffering from acute compartment syndrome though by orthopedics team.  We will plan on admitting to medicine and orthopedics will do continuous compartment checks overnight to ensure patient does not have any further progression.  A CT of the knee was ordered for operative planning.  The case was discussed with Dr. Talbert Cage of hospital medicine who admitted the patient.             DISPOSITION & IMPRESSION   Disposition: Admit    Clinical Impressions:   [  S82.142A] Closed fracture of left tibial plateau, initial encounter              [place] - No Critical Care           ADDITIONAL INFORMATION REVIEWED  - ATTENDING ONLY             Joneen Boers, MD  03/30/21 1045

## 2021-03-29 NOTE — Consults (Signed)
Orthopaedic Surgery Consultation Note    Date of Service: 03/29/21  Reason for Consult: Left tibial plateau fracture  Attending of Record: Lazaro Arms, MD  Referring Provider: Emergency Department  Non-Orthopedic Injuries: None    History of Present Illness:   Theresa Giles is a 70 year old female with a history of HTN, afib, chronic pain on oxycodone q6h, osteoporosis, and multiple prior fragility fractures presenting after a fall with a closed L tibial plateau fracture. Of note, she is not on any blood thinners for her afib due to frequent falls.    Patient was walking through her son's house earlier today and tripped over a stair, landing on the L side of her body and hitting her forehead against the ground. She states that she did not lose her balance or syncopize, she just forgot the step was there. She denies LOC after hitting her head. She endorses pain to the LLE and L elbow. She is having numbness in the L foot, but denies numbness in the LUE or other extremities.    She lives alone in [place] and is currently in [place] visiting her son. She ambulates without assistive devices in the home, but brings a cane for longer distances outside the house. She has sustained multiple prior fragility fractures secondary to falls, including a L distal femur fracture in November 2020 and a R hip fracture. She denies balance issues, syncopal events, or LE weakness.    Review of systems:   Pertinent positives as per HPI    Past Medical History:   HTN  Afib  Osteoporosis  Falls    Past Surgical History:   L distal femur ORIF  L THA  R hip fracture fixation, unclear on what was done    Allergies:   Review of patient's allergies indicates:  Allergies   Allergen Reactions   . Penicillin V Skin: Rash   . Skin Adhesives [Cyanoacrylate] Skin: Rash     Medications:  Current Outpatient Medications   Medication Instructions   . aspirin 81 mg, Oral, Daily   . B Complex Vitamins (VITAMIN B COMPLEX OR) 1 tablet, Oral, Daily   . Calcium  Carb-Cholecalciferol [phone] MG-UNIT tablet 1 tablet, Oral, Daily   . DULoxetine (CYMBALTA) 30 mg, Oral, Daily   . furosemide (LASIX) 20 mg, Oral, Daily PRN, Last filled in 2019, per Rx.   . iron 325 mg, Oral, Daily   . metoprolol tartrate (LOPRESSOR) 50 mg, Oral, 2 times daily   . oxyCODONE-acetaminophen 5-325 MG tablet 1 tablet, Oral, Every 6 hours   . potassium chloride ER 20 MEQ ER tablet Oral, Daily PRN, Last filled on 2019, per Rx   . Prenatal Vit-Fe Fumarate-FA (PRENATAL VITAMIN OR) 1 tablet, Oral, Daily   . QUEtiapine (SEROQUEL) 25 mg, Oral, 2 times daily   . spironolactone (ALDACTONE) 25 mg, Oral, Every morning   . Vitamin D (Cholecalciferol) 25 MCG (1000 UT) capsule 1 capsule, Oral, Daily        Family History:    No history of bleeding or clotting disorders.    Social History:     Phone Number:   Telephone Information:   Home Phone [phone]   Work Phone Not on file.   Mobile [phone]     Lives in [place] alone.  Tobacco: Denies.   Drugs: Denies.  EtOH: 4 drinks per day  Occupation: Retired, enjoys gardening  Functional Status: Ambulates with a cane for long distances, no assistive devices while at home.  Physical Exam:    BP (!) 161/91   Pulse 86   Temp 36.5 C   Resp 18   Wt 48.7 kg (107 lb 5.8 oz)   SpO2 98%     Physical Exam (performed at 2000 hours on 03/29/2021 )   GEN: Thin elderly female, in pain  RESP: Breathing easily on room air, no accessory muscle use   CV: Hypertensive, normal heart rate, extremities warm and well perfused.   MSK:    LEFT Lower Extremity Musculoskeletal Exam   Inspection: Large hematoma over proximal anterior and lateral tibia, tender to palpation. Non-tender over thigh, foot, and ankle.  Sensory: Sensation diminished to light touch in distribution of deep peroneal and superficial peroneal nerves. At baseline she has numbness in this area, but states that her sensation is currently less than her baseline. Sensation intact to light touch in distribution of  tibial, sural, and saphenous nerve distributions.   Motor: 5/5 extensor hallicus longus /flexor hallicus longus/ gastroc-soleus/tibialis anterior   Vascular: 2+ dorsalis pedis pulse, toes warm and well perfused.   Compartments: Proximal anterior and lateral compartments are swollen and tight in area of above noted hematoma, proximal superficial posterior compartment is swollen but easily compressible. Distal anterior, lateral, and superficial posterior compartments are soft and easily compressible. She has no pain with passive or active motion of the hallux or ankle.      RIGHT Lower Extremity Musculoskeletal Exam   Inspection: No gross deformity. Skin without blanching, tenting, or puckering.   Sensory: Sensation intact to light touch in distribution of deep peroneal, superficial peroneal, tibial, sural, and saphenous nerve distributions.   Motor: 5/5 extensor hallicus longus /flexor hallicus longus/ gastroc-soleus/tibialis anterior   Vascular: 2+ dorsalis pedis pulse, toes warm and well perfused.   Compartments: Anterior, lateral, and superficial posterior compartments are soft and compressible.     Labs:  Labs (last 24 hours):   Chemistries  CBC  LFT  Gases, other   132 99 28 119   12.0   AST: 37 ALT: 21  -/-/-/-  -/-/-/-   4.9 24 1.18   5.75 >< 212  AP: 115 T bili: 0.8  Lact (a): - Lact (v): -   eGFR: 47 Ca: 10.2   36   Prot: 6.7 Alb: 3.9  Trop I: <0.03 D-dimer: -   Mg: - PO4: -  ANC: 3.77     BNP: - Anti-Xa: -     ALC: 1.19    INR: 1.0        Radiology:  XR and CT scan of L tibia demonstrate a Schatzker VI tibial plateau fracture, minimally displaced, and diffuse osteoporosis. She is also s/ p ORIF of the L distal femur with lateral plate in place without signs of loosening.    Assessment and Plan:  Theresa Giles is a 70 year old female with osteoporosis and history of multiple fragility fractures presenting with a closed L tibial plateau fracture after a ground level fall.     Given the unstable nature of  their injury they will require operative fixation. They were consented for ORIF and possible fasciotomies of the L tibia. We discussed the risks, benefits, and alternatives to surgery which include infection, malunion or nonunion which may require additional surgery in the future to correct, blood loss, risks of anesthesia including problems with blood pressure and heart rate, postoperative stiffness/loss of ROM, and damage to surrounding structures, which in this case include the peroneal nerve near the knee and other  vessels and nerves around the knee. Alternatives to operative fixation include non-operative management, however we disscuessed that this would likely lead to longer time to weight bearing and higher likelihood of malunion. Patient expressed understanding of these risks and alternatives and elected to proceed with surgery. Consent form was signed by the patient.      In the ED, they were placed into a well padded Long Leg Splint (left lower extremity).     In addition, I discussed with the patient that she has significant swelling of her leg in the area of her proximal tibial hematoma. While her new numbness in the DP and SP distribution are concerning, this is likely due to compressive neuropathy from the hematoma rather than compartment swelling. She has no pain with passive or active motion of the hallux or ankle and her distal compartments are soft and compressible. The area of her most significant swelling corresponds with her known hematoma. After discussion with surgical attending on call, Dr. Geryl Councilman, we will plan to observe her closely overnight for worsening swelling or evolving neurovascular exam. She should elevate the extremity as much as possible to reduce swelling. I will return to examine her in approximately 4 hours or sooner if concerns arise.     This plan was discussed with Dr. Remonia Richter, ortho chief on call, and Dr. Geryl Councilman, ortho attending on call, who are in agreement.        Robet Leu, MD, PGY3  [facility]  [facility]

## 2021-03-29 NOTE — ED Notes (Addendum)
Patient concerned about pain management d/t chronic pain on top of current injuries. Patient educated on admission status and that providers are aware of her concern. Assisted patient to position of comfort. Wound care provided by MA.        Hyacinth Meeker, RN  03/29/21 2138      Patient continues to voice concern about pain management. Offered alternatives, distractions and repositioning. Provided patient with current orders for pain management. Patient requesting higher dose of oxycodone. MDs at bedside updating patient on plan of care.      Hyacinth Meeker, RN  03/29/21 2140

## 2021-03-29 NOTE — ED Triage Notes (Addendum)
Patient PTA was walking downstairs to go to the hot tub and missed a step and fell onto her left side. Patient has an obvious deformity to the left leg, abrasion to left elbow and skin tear to the left temple. Patient has a hx of A-fib and takes baby ASA daily. + head strike , no neck or back pain. Patient arrives in vacuum splint to the left leg.

## 2021-03-29 NOTE — Nursing Note (Signed)
ADT Time:2205hr   Transported From: ED  To: 447   Transportation Method:  stretcher  Accompanied By:  transporter  Condition on Arrival:  Stable.   SBAR Report Provided to:  RN. Nisha  Location of Personal Belongings:  With pt.   Items in [facility] or Pharmacy Returned to Patient: N/A      Comments: Pt A/ox3. CMS intact to lt leg. Lt leg soft cast on. Lt forehead abrasion bandaid dry and intact. Lt elbow skin tear kerlix dressing CDi. ED RN applied bacitracin and wrapped w/ kerlix wrap since pt is allergic to adhesive. Given room orientation. Explained about fall prevention. Bed alarm on.

## 2021-03-29 NOTE — H&P (Signed)
History and Physical     Theresa Giles ("Timmya") - DOB: 1951/07/24 (70 year old female)  PCP: Pcp, Unknown   Code Status: No Order       CHIEF CONCERN / IDENTIFICATION:  Theresa Giles is a 70 year old female with a fall and left tibial fracture     SUBJECTIVE   HISTORY OF PRESENT ILLNESS:   This is a 70 year old female visiting her son from [place]. Yesterday she had a fall after she missed the stairs at her son's place, afterwards complaining left leg pain. She hit her head, but did not lose consciousness. Prior to fall, she did not experience chest pain or shortness of breath. No lightheadedness. No n/v/d, had a regular bowel movement today. No abdominal pain. No dysruia.     She has chronic pain takes percocet every 6 hours at home. She is mostly concerned about pain control, thinking percocet every 6 hours would not be helpful.   She has 3-4 beer through out the day. She managed to have 3 sips before she came to the hospital. No history of withdrawal per patient even with her previous long hospital stay.     She denied kidney disease. Takes lasix PRN for leg edema. Had not needed it for the past few months.   On Metoprolol and aspirin for afib.     Review of Systems  I have performed a complete review of systems with the patient, which is negative except as indicated per HPI.      HISTORY   Problem List   Diagnosis   . Left tibial fracture   . Tibial plateau fracture, left, closed, initial encounter   . A-fib [facility])   multiple orthopedic surgeries  Intermittent leg edema    SH: 3-4 beer a day, no history of withdrawal  Quit smoking years ago       FH  Mother had metastatic colon cancer       OUTPATIENT MEDICATIONS:   Current Outpatient Medications   Medication Instructions   . aspirin 81 mg, Oral, Daily   . B Complex Vitamins (VITAMIN B COMPLEX OR) 1 tablet, Oral, Daily   . Calcium Carb-Cholecalciferol [phone] MG-UNIT tablet 1 tablet, Oral, Daily   . DULoxetine (CYMBALTA) 30 mg, Oral, Daily   . furosemide  (LASIX) 20 mg, Oral, Daily PRN, Last filled in 2019, per Rx.   . iron 325 mg, Oral, Daily   . metoprolol tartrate (LOPRESSOR) 50 mg, Oral, 2 times daily   . oxyCODONE-acetaminophen 5-325 MG tablet 1 tablet, Oral, Every 6 hours   . potassium chloride ER 20 MEQ ER tablet Oral, Daily PRN, Last filled on 2019, per Rx   . Prenatal Vit-Fe Fumarate-FA (PRENATAL VITAMIN OR) 1 tablet, Oral, Daily   . QUEtiapine (SEROQUEL) 25 mg, Oral, 2 times daily   . spironolactone (ALDACTONE) 25 mg, Oral, Every morning   . Vitamin D (Cholecalciferol) 25 MCG (1000 UT) capsule 1 capsule, Oral, Daily       ALLERGIES:   Penicillin v and Skin adhesives [cyanoacrylate]      OBJECTIVE     Vitals (Arrival)      T: 36.5 C (03/29/21 1634)  BP: (!) 131/94 (03/29/21 1634)  HR: 64 (03/29/21 1634)  RR: 18 (03/29/21 1634)  SpO2: 97 % (03/29/21 1634) Room air   Vitals (Most recent in last 24 hrs)   T: 36.5 C (03/29/21 1634)  BP: (!) 161/91 (03/29/21 2130)  HR: 86 (03/29/21 2130)  RR: 18 (  03/29/21 2130)  SpO2: 98 % (03/29/21 2130) Room air  T range: Temp  Min: 36.5 C  Max: 36.5 C  Wt 107 lb 5.8 oz (48.7 kg)     (no height taken for this visit)     There is no height or weight on file to calculate BMI.       Physical Exam  General:  Older than stated age, uncomfortable due to pain  Eyes:  Anicteric, no conjunctival injection.  ENT:  Oropharynx clear without lesions or exudates. Dry OP  Chest:  Equal breath sounds bilaterally, no wheezes or crackles.  CV:  irregular rate and rhythm, no murmurs.  Abdomen:  Soft, non distended, no masses, non tender.  Extremities:  RLE no edema, LLE visually hematoma, swollen  Neuro:  Awake/alert, follows commands, answers questions appropriately.  Fluent speech.  CNII-XII intact.    Psych:  cooperative.  MS: decreased sensation left toes, moved all toes. nontender neck   Skin:  Forehead abrasion, left eye ecchymosis    Labs (last 24 hours):   Chemistries  CBC  LFT  Gases, other   132 99 28 119   12.0   AST: 37 ALT: 21   -/-/-/-  -/-/-/-   4.9 24 1.18   5.75 >< 212  AP: 115 T bili: 0.8  Lact (a): - Lact (v): -   eGFR: 47 Ca: 10.2   36   Prot: 6.7 Alb: 3.9  Trop I: <0.03 D-dimer: -   Mg: - PO4: -  ANC: 3.77     BNP: - Anti-Xa: -     ALC: 1.19    INR: 1.0        XR Chest 1 Vw   Final Result   Heart size is normal. The aorta is calcified indicating atherosclerosis.      Lung volumes are slightly low, but the lungs are clear.      No pleural effusion. No pneumothorax.      No acute fracture detected.      I have personally reviewed the images and agree with the report (or as edited).      CT Knee wo Contrast Left   Final Result   FINDINGS/IMPRESSION:   1. Moderate lipohemarthrosis.   2. Diffuse demineralization of the bones.   3. Schatzker 6 tibial plateau fracture, with transverse component through the tibial diaphysis and longitudinal component extending into the tibial spine and the medial tibial plateau.   4. Old distal femur fracture with intact lateral screw-plate construct as well as screw traversing the lateral femoral condyle.      '[]'        XR Ankle 3+ Vw Left   Final Result   No fracture detected.  Alignment preserved. Diffusely decreased bone mineral density.  No soft tissue abnormality.        XR Knee 1-2 Vw Left   Final Result   Bones are demineralized.      Acute comminuted transverse fracture through the proximal tibial diaphysis with longitudinal sagittal extension into the medial tibial plateau and tibial spine. Appearance consistent with Schatzker 6 tibial plateau fracture. Small lipohemarthrosis of the left knee.      Old healed distal left femur fracture with lateral screw-plate construct.         XR Tibia Fibula 2 Vw Left   Final Result   Bones are demineralized.      Acute comminuted transverse fracture through the proximal tibial diaphysis with longitudinal sagittal extension into the medial  tibial plateau and tibial spine. Appearance consistent with Schatzker 6 tibial plateau fracture. Small lipohemarthrosis of  the left knee.      No fracture of the mid or distal tibia. No fibula fracture.      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 Left mid and distal femur screw-plate construct with old healed distal left femur fracture.      Diffuse demineralization. No hardware fracture. No acute femur fracture deformity seen.      CT Head wo Contrast   Final Result      No acute intracranial abnormalities. No acute calvarial fracture.      This is a preliminary report dictated by Sande Rives, M.D., PGY-3.  If the attending radiologist's final report does not appear below, the report is not yet finalized, as an attending radiologist has not yet reviewed the images.          ATTENDING FINAL REVIEW      I agree with the preliminary report.                       I have personally reviewed the images and agree with the report (or as edited).      CT C Spine wo Contrast   Final Result    IMPRESSION:   There is grade 2 anterolisthesis of C2 on C3 in the setting of severe cervical spondylosis. No prevertebral soft tissue swelling. This finding could be chronic and/or degenerative in etiology. However given the lack of comparison exams and in the setting of acute trauma, if there is high clinical concern for an acute ligamentous injury of the cervical spine, MRI would be more useful for further evaluation.      No acute fracture of the cervical spine.      These findings were communicated to Dr. Earlie Server by Dr. Eber Jones at 6:17 PM on 03/29/2021.      I have personally reviewed the images and agree with the report (or as edited).           ASSESSMENT/PLAN    {Vanishing Tip  Problem List  Hospital Course  To add hospital problem list, type .HPROBL :999}  Kawthar Maultsby is a 70 year old female with a fall and left tibial fracture.    S/p fall  Left tibial fracture   Likely osteoporotic fracture  Appears mechanical fall. LLE is swollen, left toes sensations are decreased.   Ortho service will watch if  compartment syndrome.   -ortho plans OR likely this Friday  -Ca and Vit D  -fragility fracture    Atrial fibrillation  Outpatient she is taking aspirin and metoprolol  Rate is controlled  -ortho is fine with continuation of aspiration  -continue metoprolol    Abnormal creatinine  unknown baseline. With elevated BUN, will hold aldactone  Patient said she had not taken lasix for a long time    Severe cervical spondylosis  nontender neck  Will monitor. If asymptomatic, will defer to outpatient follow up.       Inpatient Checklist:    Fluids/Electrolytes/Nutrition: regular   Prophylaxis: lovenox  Lines/Drains/Airways: PIV  Disposition: inpatient      Code Status: full code  Contacts: Primary Emergency Contact: [place], Home Phone: [phone]

## 2021-03-30 ENCOUNTER — Other Ambulatory Visit (INDEPENDENT_AMBULATORY_CARE_PROVIDER_SITE_OTHER): Payer: Self-pay | Admitting: Physician Assistant

## 2021-03-30 DIAGNOSIS — R339 Retention of urine, unspecified: Secondary | ICD-10-CM

## 2021-03-30 LAB — BASIC METABOLIC PANEL
Anion Gap: 7 (ref 4–12)
Calcium: 9.2 mg/dL (ref 8.9–10.2)
Carbon Dioxide, Total: 23 meq/L (ref 22–32)
Chloride: 101 meq/L (ref 98–108)
Creatinine: 1.77 mg/dL — ABNORMAL HIGH (ref 0.38–1.02)
Glucose: 104 mg/dL (ref 62–125)
Potassium: 4.9 meq/L (ref 3.6–5.2)
Sodium: 131 meq/L — ABNORMAL LOW (ref 135–145)
Urea Nitrogen: 34 mg/dL — ABNORMAL HIGH (ref 8–21)
eGFR by CKD-EPI: 29 mL/min/{1.73_m2} — ABNORMAL LOW (ref >59)

## 2021-03-30 LAB — URINALYSIS WITH REFLEX CULTURE
Bacteria, URN: NONE SEEN
Bilirubin (Qual), URN: NEGATIVE
Epith Cells_Renal/Trans,URN: NEGATIVE /HPF
Epith Cells_Squamous, URN: NEGATIVE /LPF
Glucose Qual, URN: NEGATIVE mg/dL
Ketones, URN: NEGATIVE mg/dL
Leukocyte Esterase, URN: NEGATIVE
Nitrite, URN: NEGATIVE
Occult Blood, URN: NEGATIVE
RBC, URN: NEGATIVE /HPF
Specific Gravity, URN: 1.01 g/mL (ref 1.006–1.027)
WBC, URN: NEGATIVE /HPF
pH, URN: 6 (ref 5.0–8.0)

## 2021-03-30 LAB — EKG 12 LEAD
Atrial Rate: 535 {beats}/min
Q-T Interval: 412 ms
QRS Duration: 72 ms
QTC Calculation: 438 ms
R Axis: 22 degrees
T Axis: 246 degrees
Ventricular Rate: 68 {beats}/min

## 2021-03-30 LAB — CBC (HEMOGRAM)
Hematocrit: 31 % — ABNORMAL LOW (ref 36.0–45.0)
Hemoglobin: 9.8 g/dL — ABNORMAL LOW (ref 11.5–15.5)
MCH: 34.3 pg — ABNORMAL HIGH (ref 27.3–33.6)
MCHC: 31.9 g/dL — ABNORMAL LOW (ref 32.2–36.5)
MCV: 107 fL — ABNORMAL HIGH (ref 81–98)
Platelet Count: 190 10*3/uL (ref 150–400)
RBC: 2.86 10*6/uL — ABNORMAL LOW (ref 3.80–5.00)
RDW-CV: 14.3 % (ref 11.6–14.4)
WBC: 5.76 10*3/uL (ref 4.3–10.0)

## 2021-03-30 MED ORDER — HEPARIN SODIUM (PORCINE) 5000 UNIT/ML IJ SOLN
5000.0000 [IU] | Freq: Three times a day (TID) | INTRAMUSCULAR | Status: DC
Start: 2021-03-30 — End: 2021-04-03
  Administered 2021-03-30 – 2021-04-03 (×11): 5000 [IU] via SUBCUTANEOUS
  Filled 2021-03-30 (×12): qty 1

## 2021-03-30 MED ORDER — LIDOCAINE HCL URETHRAL/MUCOSAL 2 % EX PRSY
6.0000 mL | PREFILLED_SYRINGE | Freq: Once | CUTANEOUS | Status: AC
Start: 2021-03-30 — End: 2021-03-30
  Administered 2021-03-30: 6 mL via URETHRAL
  Filled 2021-03-30: qty 6

## 2021-03-30 MED ORDER — LACTATED RINGERS BOLUS
500.0000 mL | Freq: Once | INTRAVENOUS | Status: AC
Start: 2021-03-30 — End: 2021-03-30
  Administered 2021-03-30: 500 mL via INTRAVENOUS

## 2021-03-30 MED ORDER — ENOXAPARIN SODIUM 30 MG/0.3ML IJ SOSY
30.0000 mg | PREFILLED_SYRINGE | Freq: Two times a day (BID) | INTRAMUSCULAR | Status: DC
Start: 2021-03-30 — End: 2021-03-30

## 2021-03-30 NOTE — Progress Notes (Signed)
Progress Note     Theresa Giles ("Theresa Giles") - DOB: June 10, 1951 (70 year old female)  Admit Date: 03/29/2021  Code Status: Full Code       CHIEF CONCERN / IDENTIFICATION:  Theresa Giles is a 70 year old female with left tibial fracture     SUBJECTIVE   INTERVAL HISTORY:  -no urine output, bladder scan elevated, has urge to void.  RN attempted foley placement, failed.   -no SOB, chest pain, N/V    SCHEDULED MEDICATIONS:   .  acetaminophen, 1,000 mg, TID  .  aspirin, 81 mg, Daily  .  calcium carbonate, 500 mg, Daily with breakfast  .  cholecalciferol, 2,000 units, Daily  .  COVID-19 mRNA vaccine (Pfizer), 30 mcg, Daily with lunch  .  DULoxetine, 30 mg, Daily  .  ferrous sulfate, 324 mg, Daily  .  heparin, 5,000 units, q8h SCH  .  lidocaine, 6 mL, Once  .  metoprolol tartrate, 50 mg, BID  .  multivitamin with minerals, 1 tablet, Daily  .  oxyCODONE, 5 mg, QID  .  prenatal multivitamin iron-folic acid, 1 tablet, Daily  .  QUEtiapine, 25 mg, BID  .  Vitamin B complex, 1 tablet, Daily    INFUSED MEDICATIONS:       PRN MEDICATIONS:  acetaminophen, 650 mg, q4h PRN  .  HYDROmorphone, 0.5-1 mg, q4h PRN  .  melatonin, 3 mg, q HS PRN  .  ondansetron, 4 mg, q8h PRN  .  ondansetron, 4 mg, q8h PRN  .  oxyCODONE, 5 mg, q4h PRN  .  senna, 17.2 mg, BID PRN       OBJECTIVE     Vitals (Most recent in last 24 hrs)     T: 36.5 C (03/30/21 1100)  BP: (!) 91/55 (03/30/21 0826)  HR: 72 (03/30/21 1100)  RR: 18 (03/30/21 1100)  SpO2: 97 % (03/30/21 1100) Room air  T range: Temp  Min: 36.5 C  Max: 36.6 C  Admit weight: 48.7 kg (107 lb 5.8 oz) (03/29/21 1634)  Last weight: 50.3 kg (110 lb 14.3 oz) (03/30/21 0118)       I&Os:     Intake/Output Summary (Last 24 hours) at 03/30/2021 1458  Last data filed at 03/30/2021 1100  Intake 900 ml   Output 322 ml   Net 578 ml       Physical Exam  General:  Elderly appearing, breathing easily, no acute distress.  Eyes:  Anicteric, no conjunctival injection.  ENT:  Normocephalic, atraumatic.  Chest:  Equal breath  sounds anteriorly   CV:  Irregularly irregular, no m/r/g  Abdomen:  Soft, non distended, no masses, non tender.  Extremities:  no lower extremity edema.  MS: Left leg in splint  Neuro:  Awake/alert, follows commands, answers questions appropriately.  Fluent speech.  Moving all 4 extremities without difficulty.  Psych:  Calm and cooperative.  Skin:  Forehead abrasion, left periorbital ecchymosis.      Labs (last 24 hours):   Chemistries  CBC  LFT  Gases, other   131 101 34 104   9.8   AST: 37 ALT: 21  -/-/-/-  -/-/-/-   4.9 23 1.77   5.76 >< 190  AP: 115 T bili: 0.8  Lact (a): - Lact (v): -   eGFR: 29 Ca: 9.2   31   Prot: 6.7 Alb: 3.9  Trop I: <0.03 D-dimer: -   Mg: - PO4: -  ANC: 3.77  BNP: - Anti-Xa: -     ALC: 1.19    INR: 1.0           Reviewed Results? Independently visualized & interpreted? Key Findings     Lab _0  _1     Radiology _2  _3     EKG/Tele/Echo  _4  _5     Other?  _6  _7             ASSESSMENT/PLAN        S/p fall  Left tibial plateau fracture   Likely osteoporotic fracture  Appears mechanical fall. LLE is swollen  -ortho plans OR likely this Friday  -Ca and Vit D  -fragility fracture consult  -oxycodone scheduled + PRN    Atrial fibrillation  Outpatient she is taking aspirin and metoprolol  Rate is controlled  -ortho is fine with continuation of aspirin  -continue metoprolol    Acute renal failure  Urinary retention  Suspect pre-renal, also with urinary retention.    -UA  -foley ordered  -IV fluid  -recheck creatinine tomorrow    Severe cervical spondylosis  outpatient follow up.     Prophylaxis: heparin

## 2021-03-30 NOTE — Nursing Note (Signed)
Illness Severity  Stable    Patient Summary  Patient is 70 year old female admitted after a fall resulting in L tibial plateau closed fx. Skin tear on Lt elbow. Abrasion to Lt forehead.     Hx: a-fib, HTN, osteoporosis, falls, chronic pain, multiple orthopedic injuries.     Action Items  -pain mgt with Oxycodone and IV dilaudid  -fall prevention/bed alarm  -Purewick  -CIWA Q shift  -Bedrest, LLE in long leg splint  -NWB to LLE

## 2021-03-30 NOTE — Nursing Note (Signed)
Pt unable to void since prior to admit. Bladder scan done and showed 322 but MD wanted pt to have foley, as she is bedrest, unable to void and needs UA. 3 RN's attempted to get a catheter placed, unsuccessfully, STAT RN able to get foley placed and UA sent, urine very dark/concentrated. LLE elevated on pillows, good sensation to toes, wiggling digits. Dressing to L elbow changed. Oxycodone PRN and RTC ordered, additional iv dilaudid given for pain. Plan OR tomorrow or Friday. IV bolus x 2 ordered and given for low BP. Improved now

## 2021-03-30 NOTE — Progress Notes (Signed)
ORTHOPEDIC SURGERY DAILY PROGRESS NOTE      ADMIT DATE: 03/29/2021     PROCEDURES:   NA    ASSESSMENT:   70 year old female who sustained a L tibial shaft fracture with extension into the proximal tibial articular surface after a mGLF    PLAN:   Activity / WB status: NWB LLE  Antibiotics: None Indicated  Anticoagulation plan: Enoxaparin 30mg  BID (continue inpatient and outpatient for a minimum duration of 6 weeks)  Dispo: pending clinical course  Follow up: Pending clinical course.    #L tibial shaft fx  -pending operative fixation, 5/12 vs. 5/13  -Compartment checks reassuring overnight    PROBLEM LIST:  Patient Active Problem List    Diagnosis   . Left tibial fracture   . Tibial plateau fracture, left, closed, initial encounter   . A-fib (HCC)          SUBJECTIVE:   Pain slightly improved this AM compared to last night  Numbness remains stable on dorsum of foot  No pain with active and passive ROM of toes    VITALS: (Most recent and 24 hour range.)   Temp:  [36.5 C-36.6 C] 36.5 C  Pulse:  [63-86] 75  Resp:  [18] 18  BP: (88-161)/(53-99) 88/53  MAP (mmHg):  [64-106] 64  SpO2:  [93 %-100 %] 93 %  Height:  [5\' 3"  (160 cm)] 5\' 3"  (160 cm)  Weight:  [48.7 kg (107 lb 5.8 oz)-50.3 kg (110 lb 14.3 oz)] 50.3 kg (110 lb 14.3 oz)    PHYSICAL EXAM:   GEN: no acute distress  RESP: no increased WOB  PSYCH: answering questions appropriately  MSK:  LLE  Long leg splint in place  Stable swelling over proximal tibia at site of fracture  Remainder of leg/compartments are soft and compressible  No pain with passive extension or flexion of all toes  Palpable DP pulse  Sensation stably diminished in Sp and DP distribution,   sensation intact to light touch in distribution of sural, saphenous and tibial distribution  Able to flex and extend great toe  Toes warm and well perfused    SCHEDULED MEDS:   acetaminophen, 1,000 mg, Oral, TID  aspirin, 81 mg, Oral, Daily  calcium carbonate, 500 mg, Oral, Daily with breakfast  cholecalciferol,  2,000 units, Oral, Daily  COVID-19 mRNA vaccine (Pfizer), 30 mcg, Intramuscular, Daily with lunch  DULoxetine, 30 mg, Oral, Daily  enoxaparin, 30 mg, Subcutaneous, q HS  ferrous sulfate, 324 mg, Oral, Daily  metoprolol tartrate, 50 mg, Oral, BID  multivitamin with minerals, 1 tablet, Oral, Daily  oxyCODONE, 5 mg, Oral, QID  prenatal multivitamin iron-folic acid, 1 tablet, Oral, Daily  QUEtiapine, 25 mg, Oral, BID  Vitamin B complex, 1 tablet, Oral, Daily         PRN MEDS:   acetaminophen, 650 mg, q4h PRN  HYDROmorphone, 0.5-1 mg, q4h PRN  melatonin, 3 mg, q HS PRN  ondansetron, 4 mg, q8h PRN  ondansetron, 4 mg, q8h PRN  oxyCODONE, 5 mg, q4h PRN  senna, 17.2 mg, BID PRN

## 2021-03-30 NOTE — Progress Notes (Signed)
ORTHOPEDIC SURGERY DAILY PROGRESS NOTE      ADMIT DATE: 03/29/2021     PROCEDURES:   NA    ASSESSMENT:   70 year old female who sustained a L tibial shaft fracture with extension into the proximal tibial articular surface after a mGLF    PLAN:   Activity / WB status: NWB LLE  Antibiotics: None Indicated  Anticoagulation plan: Heparin due to elevated creatinine  Dispo: pending clinical course  Follow up: Pending clinical course.    #L tibial shaft fx  -pending operative fixation, 5/13  -Compartment checks continue to be reassuring    PROBLEM LIST:  Patient Active Problem List    Diagnosis   . Left tibial fracture   . Tibial plateau fracture, left, closed, initial encounter   . A-fib (HCC)          SUBJECTIVE:   Pain improved this evening compared to this AM    VITALS: (Most recent and 24 hour range.)   Temp:  [36.5 C-36.6 C] 36.6 C  Pulse:  [63-86] 68  Resp:  [18-19] 19  BP: (88-161)/(52-99) 90/52  MAP (mmHg):  [61-106] 61  SpO2:  [93 %-100 %] 95 %  Height:  [5\' 3"  (160 cm)] 5\' 3"  (160 cm)  Weight:  [50.3 kg (110 lb 14.3 oz)] 50.3 kg (110 lb 14.3 oz)    PHYSICAL EXAM:   GEN: no acute distress  RESP: no increased WOB  PSYCH: answering questions appropriately  MSK:  LLE  Long leg splint in place  Stable swelling over proximal tibia at site of fracture  Remainder of leg/compartments are soft and compressible  No pain with passive extension or flexion of all toes  Palpable DP pulse  Sensation stably diminished in Sp and DP distribution,   sensation intact to light touch in distribution of sural, saphenous and tibial distribution  Able to flex and extend great toe without pain  Toes warm and well perfused    SCHEDULED MEDS:   acetaminophen, 1,000 mg, Oral, TID  aspirin, 81 mg, Oral, Daily  calcium carbonate, 500 mg, Oral, Daily with breakfast  cholecalciferol, 2,000 units, Oral, Daily  COVID-19 mRNA vaccine (Pfizer), 30 mcg, Intramuscular, Daily with lunch  DULoxetine, 30 mg, Oral, Daily  ferrous sulfate, 324 mg, Oral,  Daily  heparin, 5,000 units, Subcutaneous, q8h [facility]  metoprolol tartrate, 50 mg, Oral, BID  multivitamin with minerals, 1 tablet, Oral, Daily  oxyCODONE, 5 mg, Oral, QID  prenatal multivitamin iron-folic acid, 1 tablet, Oral, Daily  QUEtiapine, 25 mg, Oral, BID  Vitamin B complex, 1 tablet, Oral, Daily         PRN MEDS:   acetaminophen, 650 mg, q4h PRN  HYDROmorphone, 0.5-1 mg, q4h PRN  melatonin, 3 mg, q HS PRN  ondansetron, 4 mg, q8h PRN  ondansetron, 4 mg, q8h PRN  oxyCODONE, 5 mg, q4h PRN  senna, 17.2 mg, BID PRN

## 2021-03-31 ENCOUNTER — Encounter (INDEPENDENT_AMBULATORY_CARE_PROVIDER_SITE_OTHER): Payer: Self-pay | Admitting: Student in an Organized Health Care Education/Training Program

## 2021-03-31 DIAGNOSIS — W19XXXA Unspecified fall, initial encounter: Secondary | ICD-10-CM

## 2021-03-31 DIAGNOSIS — S82102A Unspecified fracture of upper end of left tibia, initial encounter for closed fracture: Secondary | ICD-10-CM

## 2021-03-31 DIAGNOSIS — M4302 Spondylolysis, cervical region: Secondary | ICD-10-CM

## 2021-03-31 DIAGNOSIS — N179 Acute kidney failure, unspecified: Secondary | ICD-10-CM

## 2021-03-31 LAB — BASIC METABOLIC PANEL
Anion Gap: 9 (ref 4–12)
Calcium: 9.6 mg/dL (ref 8.9–10.2)
Carbon Dioxide, Total: 21 meq/L — ABNORMAL LOW (ref 22–32)
Chloride: 97 meq/L — ABNORMAL LOW (ref 98–108)
Creatinine: 1.79 mg/dL — ABNORMAL HIGH (ref 0.38–1.02)
Glucose: 105 mg/dL (ref 62–125)
Potassium: 5.1 meq/L (ref 3.6–5.2)
Sodium: 127 meq/L — ABNORMAL LOW (ref 135–145)
Urea Nitrogen: 37 mg/dL — ABNORMAL HIGH (ref 8–21)
eGFR by CKD-EPI: 29 mL/min/{1.73_m2} — ABNORMAL LOW (ref >59)

## 2021-03-31 LAB — CBC, DIFF
% Basophils: 1 %
% Eosinophils: 6 %
% Immature Granulocytes: 0 %
% Lymphocytes: 12 %
% Monocytes: 5 %
% Neutrophils: 76 %
% Nucleated RBC: 0 %
Absolute Eosinophil Count: 0.36 10*3/uL (ref 0.00–0.50)
Absolute Lymphocyte Count: 0.72 10*3/uL — ABNORMAL LOW (ref 1.00–4.80)
Basophils: 0.06 10*3/uL (ref 0.00–0.20)
Hematocrit: 31 % — ABNORMAL LOW (ref 36.0–45.0)
Hemoglobin: 9.6 g/dL — ABNORMAL LOW (ref 11.5–15.5)
Immature Granulocytes: 0 10*3/uL (ref 0.00–0.05)
MCH: 34.9 pg — ABNORMAL HIGH (ref 27.3–33.6)
MCHC: 31.4 g/dL — ABNORMAL LOW (ref 32.2–36.5)
MCV: 111 fL — ABNORMAL HIGH (ref 81–98)
Monocytes: 0.3 10*3/uL (ref 0.00–0.80)
Neutrophils: 4.55 10*3/uL (ref 1.80–7.00)
Nucleated RBC: 0 10*3/uL
Platelet Count: 164 10*3/uL (ref 150–400)
RBC: 2.75 10*6/uL — ABNORMAL LOW (ref 3.80–5.00)
RDW-CV: 14.3 % (ref 11.6–14.4)
WBC: 5.99 10*3/uL (ref 4.3–10.0)

## 2021-03-31 MED ORDER — SODIUM CHLORIDE 0.9% IV BOLUS
500.0000 mL | Freq: Once | INTRAVENOUS | Status: AC
Start: 2021-03-31 — End: 2021-03-31
  Administered 2021-03-31: 500 mL via INTRAVENOUS

## 2021-03-31 MED ORDER — SODIUM CHLORIDE 0.9 % IV SOLN
75.0000 mL/h | INTRAVENOUS | Status: AC
Start: 2021-04-01 — End: 2021-04-02
  Administered 2021-04-01: 75 mL/h via INTRAVENOUS

## 2021-03-31 NOTE — Progress Notes (Signed)
ORTHOPEDIC SURGERY DAILY PROGRESS NOTE      ADMIT DATE: 03/29/2021     PROCEDURES:   NA    ASSESSMENT:   70 year old female who sustained a L tibial shaft fracture with extension into the proximal tibial articular surface after a mGLF    PLAN:   Activity / WB status: NWB LLE  Antibiotics: None Indicated  Anticoagulation plan: Heparin due to elevated creatinine  Dispo: pending clinical course  Follow up: Pending clinical course.    #L tibial shaft fx  -pending operative fixation, 5/13  -Compartment checks continue to be reassuring    PROBLEM LIST:  Patient Active Problem List    Diagnosis   . Left tibial fracture   . Tibial plateau fracture, left, closed, initial encounter   . A-fib (HCC)          SUBJECTIVE:   Slept well overnight  Just jerked leg so pain slightly increased but was well controlled before that    VITALS: (Most recent and 24 hour range.)   Temp:  [36.5 C-36.6 C] 36.6 C  Pulse:  [68-79] 79  Resp:  [18-19] 18  BP: (88-115)/(52-64) 115/61  MAP (mmHg):  [61-79] 79  SpO2:  [93 %-100 %] 96 %    PHYSICAL EXAM:   GEN: no acute distress  RESP: no increased WOB  PSYCH: answering questions appropriately  MSK:  LLE  Long leg splint in place  Stable swelling over proximal tibia at site of fracture  Remainder of leg/compartments are soft and compressible  No pain with passive extension or flexion of all toes  Palpable DP pulse  Sensation stably diminished in Sp and DP distribution,   sensation intact to light touch in distribution of sural, saphenous and tibial distribution  Able to flex and extend great toe without pain  Toes warm and well perfused    SCHEDULED MEDS:   acetaminophen, 1,000 mg, Oral, TID  aspirin, 81 mg, Oral, Daily  calcium carbonate, 500 mg, Oral, Daily with breakfast  cholecalciferol, 2,000 units, Oral, Daily  DULoxetine, 30 mg, Oral, Daily  ferrous sulfate, 324 mg, Oral, Daily  heparin, 5,000 units, Subcutaneous, q8h [facility]  metoprolol tartrate, 50 mg, Oral, BID  multivitamin with minerals, 1  tablet, Oral, Daily  oxyCODONE, 5 mg, Oral, QID  prenatal multivitamin iron-folic acid, 1 tablet, Oral, Daily  QUEtiapine, 25 mg, Oral, BID  Vitamin B complex, 1 tablet, Oral, Daily         PRN MEDS:   acetaminophen, 650 mg, q4h PRN  HYDROmorphone, 0.5-1 mg, q4h PRN  melatonin, 3 mg, q HS PRN  ondansetron, 4 mg, q8h PRN  ondansetron, 4 mg, q8h PRN  oxyCODONE, 5 mg, q4h PRN  senna, 17.2 mg, BID PRN

## 2021-03-31 NOTE — Nursing Note (Signed)
Illness Severity  Stable    Patient Summary  Patient is 70 year old female admitted after a fall resulting in L tibial plateau closed fx. Skin tear on Lt elbow. Abrasion to Lt forehead.     Hx: a-fib, HTN, osteoporosis, falls, chronic pain, multiple orthopedic injuries.     Alert and oriented, can make needs known. Oxycodone 5mg  PRN for pain. LLE elevated on pillows, CMS intact. Bed alarm on. CIWA zero. NWB to LLE. Foley catheter patent.

## 2021-03-31 NOTE — Nursing Note (Signed)
Patient Summary  Patient is 70 year old female admitted after a fall resulting in L tibial plateau closed fx. Skin tear on Lt elbow. Abrasion to Lt forehead.     Hx: a-fib, HTN, osteoporosis, falls, chronic pain, multiple orthopedic injuries.     Pt A&Ox4 today but forgetful at times. Pain well controlled with PRN and scheduled oxycodone. Left leg splinted and elevated in bed. Repositioned in bed frequently. Pt scored 5 on CIWA today for anxiety and headache, but doesn't appear to be withdrawing. Will be NPO tonight for surgery in AM.  Edited by: Duke Salvia, RN at 03/30/2021 0448    Illness Severity  Stable  Edited by: Duke Salvia, RN at 03/30/2021 [phone]

## 2021-03-31 NOTE — Progress Notes (Signed)
Progress Note     Theresa Giles ("Anayelli") - DOB: 28-Dec-1950 (70 year old female)  Admit Date: 03/29/2021  Code Status: Full Code       CHIEF CONCERN / IDENTIFICATION:  Theresa Giles is a 70 year old female with left tibial fracture     SUBJECTIVE   INTERVAL HISTORY:  -foley catheter placed  -no SOB, chest pain, N/V    SCHEDULED MEDICATIONS:   .  acetaminophen, 1,000 mg, TID  .  aspirin, 81 mg, Daily  .  calcium carbonate, 500 mg, Daily with breakfast  .  cholecalciferol, 2,000 units, Daily  .  DULoxetine, 30 mg, Daily  .  ferrous sulfate, 324 mg, Daily  .  heparin, 5,000 units, q8h [facility]  .  metoprolol tartrate, 50 mg, BID  .  multivitamin with minerals, 1 tablet, Daily  .  oxyCODONE, 5 mg, QID  .  prenatal multivitamin iron-folic acid, 1 tablet, Daily  .  QUEtiapine, 25 mg, BID  .  Vitamin B complex, 1 tablet, Daily    INFUSED MEDICATIONS:       PRN MEDICATIONS:  acetaminophen, 650 mg, q4h PRN  .  HYDROmorphone, 0.5-1 mg, q4h PRN  .  melatonin, 3 mg, q HS PRN  .  ondansetron, 4 mg, q8h PRN  .  ondansetron, 4 mg, q8h PRN  .  oxyCODONE, 5 mg, q4h PRN  .  senna, 17.2 mg, BID PRN       OBJECTIVE     Vitals (Most recent in last 24 hrs)     T: 37.4 C (03/31/21 0841)  BP: 111/65 (03/31/21 0841)  HR: (!) 105 (03/31/21 0841)  RR: 18 (03/31/21 0841)  SpO2: 94 % (03/31/21 0841) Room air  T range: Temp  Min: 36.5 C  Max: 37.4 C  Admit weight: 48.7 kg (107 lb 5.8 oz) (03/29/21 1634)  Last weight: 50.3 kg (110 lb 14.3 oz) (03/30/21 0118)       I&Os:     Intake/Output Summary (Last 24 hours) at 03/31/2021 0945  Last data filed at 03/31/2021 4270  Intake 940 ml   Output 1122 ml   Net -182 ml       Physical Exam  General:  Elderly appearing, breathing easily, no acute distress.  Eyes:  Anicteric, no conjunctival injection.  ENT:  Normocephalic, atraumatic.  Chest:  Equal breath sounds anteriorly   CV:  Irregularly irregular, no m/r/g  Abdomen:  Soft, non distended, no masses, non tender.  Extremities:  no lower extremity edema.  MS:  Left leg in splint  GU: foley  Neuro:  Awake/alert, follows commands, answers questions appropriately.  Fluent speech.  Moving all 4 extremities without difficulty.  Psych:  Calm and cooperative.  Skin:  Forehead abrasion      Labs (last 24 hours):   Chemistries  CBC  LFT  Gases, other   127 97 37 105   9.6   AST: - ALT: -  -/-/-/-  -/-/-/-   5.1 21 1.79   5.99 >< 164  AP: - T bili: -  Lact (a): - Lact (v): -   eGFR: 29 Ca: 9.6   31   Prot: - Alb: -  Trop I: - D-dimer: -   Mg: - PO4: -  ANC: 4.55     BNP: - Anti-Xa: -     ALC: 0.72    INR: -           Reviewed Results? Independently visualized & interpreted? Key Findings  Lab _0  _1     Radiology _2  _3     EKG/Tele/Echo  _4  _5     Other?  _6  _7             ASSESSMENT/PLAN        S/p fall  Left tibial plateau fracture   Likely osteoporotic fracture  Appears mechanical fall  -ortho plans OR tomorrow  -Ca and Vit D  -fragility fracture consult  -oxycodone scheduled + PRN    Atrial fibrillation  Outpatient she is taking aspirin and metoprolol  Rate is controlled  -ortho is fine with continuation of aspirin  -continue metoprolol    Acute renal failure  Urinary retention  Suspect pre-renal, also with urinary retention now resolved with foley catheter  -foley  -repeat IV fluid bolus    Severe cervical spondylosis  outpatient follow up.     Prophylaxis: heparin

## 2021-04-01 ENCOUNTER — Inpatient Hospital Stay (HOSPITAL_COMMUNITY): Payer: Medicare Other

## 2021-04-01 ENCOUNTER — Encounter (HOSPITAL_COMMUNITY): Payer: Self-pay | Admitting: Certified Registered"

## 2021-04-01 ENCOUNTER — Encounter (HOSPITAL_COMMUNITY)
Admission: AC | Disposition: A | Discharge status: Skilled Nursing Facility | Payer: Self-pay | Source: Home / Self Care | Attending: Internal Medicine

## 2021-04-01 ENCOUNTER — Encounter (HOSPITAL_COMMUNITY): Payer: Medicare Other | Admitting: Certified Registered"

## 2021-04-01 DIAGNOSIS — M47892 Other spondylosis, cervical region: Secondary | ICD-10-CM

## 2021-04-01 DIAGNOSIS — S82292A Other fracture of shaft of left tibia, initial encounter for closed fracture: Secondary | ICD-10-CM

## 2021-04-01 DIAGNOSIS — S82142A Displaced bicondylar fracture of left tibia, initial encounter for closed fracture: Secondary | ICD-10-CM

## 2021-04-01 DIAGNOSIS — S82141A Displaced bicondylar fracture of right tibia, initial encounter for closed fracture: Secondary | ICD-10-CM

## 2021-04-01 DIAGNOSIS — S82145A Nondisplaced bicondylar fracture of left tibia, initial encounter for closed fracture: Secondary | ICD-10-CM

## 2021-04-01 DIAGNOSIS — D538 Other specified nutritional anemias: Secondary | ICD-10-CM

## 2021-04-01 LAB — CBC, DIFF
% Basophils: 1 %
% Eosinophils: 5 %
% Immature Granulocytes: 0 %
% Lymphocytes: 8 %
% Monocytes: 5 %
% Neutrophils: 81 %
% Nucleated RBC: 0 %
Absolute Eosinophil Count: 0.15 10*3/uL (ref 0.00–0.50)
Absolute Lymphocyte Count: 0.24 10*3/uL — ABNORMAL LOW (ref 1.00–4.80)
Basophils: 0.02 10*3/uL (ref 0.00–0.20)
Hematocrit: 26 % — ABNORMAL LOW (ref 36.0–45.0)
Hemoglobin: 8.3 g/dL — ABNORMAL LOW (ref 11.5–15.5)
Immature Granulocytes: 0.01 10*3/uL (ref 0.00–0.05)
MCH: 34.6 pg — ABNORMAL HIGH (ref 27.3–33.6)
MCHC: 31.6 g/dL — ABNORMAL LOW (ref 32.2–36.5)
MCV: 110 fL — ABNORMAL HIGH (ref 81–98)
Monocytes: 0.14 10*3/uL (ref 0.00–0.80)
Neutrophils: 2.34 10*3/uL (ref 1.80–7.00)
Nucleated RBC: 0 10*3/uL
Platelet Count: 151 10*3/uL (ref 150–400)
RBC: 2.4 10*6/uL — ABNORMAL LOW (ref 3.80–5.00)
RDW-CV: 14 % (ref 11.6–14.4)
WBC: 2.9 10*3/uL — ABNORMAL LOW (ref 4.3–10.0)

## 2021-04-01 LAB — BASIC METABOLIC PANEL
Anion Gap: 6 (ref 4–12)
Calcium: 9 mg/dL (ref 8.9–10.2)
Carbon Dioxide, Total: 22 meq/L (ref 22–32)
Chloride: 100 meq/L (ref 98–108)
Creatinine: 1.6 mg/dL — ABNORMAL HIGH (ref 0.38–1.02)
Glucose: 102 mg/dL (ref 62–125)
Potassium: 4.5 meq/L (ref 3.6–5.2)
Sodium: 128 meq/L — ABNORMAL LOW (ref 135–145)
Urea Nitrogen: 38 mg/dL — ABNORMAL HIGH (ref 8–21)
eGFR by CKD-EPI: 33 mL/min/{1.73_m2} — ABNORMAL LOW (ref >59)

## 2021-04-01 LAB — COVID-19 CORONAVIRUS QUALITATIVE PCR: COVID-19 Coronavirus Qual PCR Result: NOT DETECTED

## 2021-04-01 LAB — RETICULOCYTE COUNT
Absolute Reticulocyte Count: 63 10*9/L (ref 25–125)
RETIC HEMOGLOBIN EQUIVALENT: 36.5 pg (ref 28.0–38.0)
Reticulocyte Count, %: 2.7 % — ABNORMAL HIGH (ref 0.5–2.5)

## 2021-04-01 LAB — IRON BINDING CAPACITY (W/IRON, TRANSFERRIN & TRANSF SAT)
Iron, SRM: 24 ug/dL — ABNORMAL LOW (ref 31–171)
Total Iron Binding Capacity: 168 ug/dL — ABNORMAL LOW (ref 270–535)
Transferrin Saturation: 14 % (ref 10–45)
Transferrin: 120 mg/dL — ABNORMAL LOW (ref 192–382)

## 2021-04-01 LAB — VITAMIN D (25 HYDROXY)
Vit D (25_Hydroxy) Total: 42 ng/mL (ref 20.1–50.0)
Vitamin D2 (25_Hydroxy): <1 ng/mL
Vitamin D3 (25_Hydroxy): 42 ng/mL

## 2021-04-01 LAB — LAB ADD ON ORDER

## 2021-04-01 LAB — FOLATE: Folate, SRM: >22 ng/mL (ref >5.8)

## 2021-04-01 LAB — HEMOGLOBIN AND HEMATOCRIT
Hematocrit: 26 % — ABNORMAL LOW (ref 36.0–45.0)
Hemoglobin: 8.2 g/dL — ABNORMAL LOW (ref 11.5–15.5)

## 2021-04-01 LAB — VITAMIN B12 (COBALAMIN): Vitamin B12 (Cobalamin): >1500 pg/mL — ABNORMAL HIGH (ref 180–914)

## 2021-04-01 LAB — GLUCOSE, FINGERSTICK POC: Glucose, Finger Stick POC: 82 mg/dL (ref 62–125)

## 2021-04-01 LAB — BLOOD TYPE CONFIRMATION: ABO/Rh: O POS

## 2021-04-01 SURGERY — INSERTION, INTRAMEDULLARY ROD, TIBIA
Anesthesia: General | Site: Leg Lower | Laterality: Left | Wound class: Class I/ Clean

## 2021-04-01 MED ORDER — NALOXONE HCL 0.4 MG/ML IJ SOLN
0.0400 mg | INTRAMUSCULAR | Status: DC | PRN
Start: 2021-04-01 — End: 2021-04-01

## 2021-04-01 MED ORDER — GLYCOPYRROLATE 0.2 MG/ML IJ SOLN
INTRAMUSCULAR | Status: DC | PRN
Start: 2021-04-01 — End: 2021-04-01
  Administered 2021-04-01: .2 mg via INTRAVENOUS

## 2021-04-01 MED ORDER — LACTATED RINGERS IV SOLN
50.0000 mL/h | INTRAVENOUS | Status: DC
Start: 2021-04-01 — End: 2021-04-02

## 2021-04-01 MED ORDER — MIDAZOLAM HCL (PF) 1 MG/ML IJ SOLN WRAPPER (ANESTHESIA OSM ONLY)
INTRAMUSCULAR | Status: DC | PRN
Start: 2021-04-01 — End: 2021-04-01
  Administered 2021-04-01: 1 mg via INTRAVENOUS

## 2021-04-01 MED ORDER — PROPOFOL 500 MG/50ML IV EMUL INFUSION
INTRAVENOUS | Status: AC
Start: 2021-04-01 — End: ?
  Filled 2021-04-01: qty 50

## 2021-04-01 MED ORDER — ROCURONIUM BROMIDE 50 MG/5ML IV SOLN
INTRAVENOUS | Status: AC
Start: 2021-04-01 — End: ?
  Filled 2021-04-01: qty 5

## 2021-04-01 MED ORDER — FENTANYL CITRATE (PF) 100 MCG/2ML IJ SOLN
INTRAMUSCULAR | Status: AC
Start: 2021-04-01 — End: ?
  Filled 2021-04-01: qty 2

## 2021-04-01 MED ORDER — GLYCOPYRROLATE 0.2 MG/ML IJ SOLN
INTRAMUSCULAR | Status: AC
Start: 2021-04-01 — End: ?
  Filled 2021-04-01: qty 1

## 2021-04-01 MED ORDER — LACTATED RINGERS IV SOLN
100.0000 mL/h | INTRAVENOUS | Status: DC
Start: 2021-04-01 — End: 2021-04-02
  Administered 2021-04-02: 100 mL/h via INTRAVENOUS

## 2021-04-01 MED ORDER — LIDOCAINE HCL 2 % IJ SOLN
INTRAMUSCULAR | Status: DC | PRN
Start: 2021-04-01 — End: 2021-04-01
  Administered 2021-04-01: 50 mg via INTRAVENOUS

## 2021-04-01 MED ORDER — PROPOFOL 10 MG/ML IV EMUL WRAPPER (OSM ONLY)
INTRAVENOUS | Status: DC | PRN
Start: 2021-04-01 — End: 2021-04-01
  Administered 2021-04-01: 25 ug/kg/min via INTRAVENOUS

## 2021-04-01 MED ORDER — ALBUTEROL SULFATE (2.5 MG/3ML) 0.083% IN NEBU
2.5000 mg | INHALATION_SOLUTION | Freq: Once | RESPIRATORY_TRACT | Status: DC | PRN
Start: 2021-04-01 — End: 2021-04-01

## 2021-04-01 MED ORDER — MIDAZOLAM HCL (PF) 2 MG/2ML IJ SOLN
INTRAMUSCULAR | Status: AC
Start: 2021-04-01 — End: ?
  Filled 2021-04-01: qty 2

## 2021-04-01 MED ORDER — ROCURONIUM BROMIDE 50 MG/5ML IV SOLN
INTRAVENOUS | Status: DC | PRN
Start: 2021-04-01 — End: 2021-04-01
  Administered 2021-04-01: 10 mg via INTRAVENOUS
  Administered 2021-04-01: 30 mg via INTRAVENOUS

## 2021-04-01 MED ORDER — FENTANYL CITRATE (PF) 100 MCG/2ML IJ SOLN
INTRAMUSCULAR | Status: AC
Start: 2021-04-01 — End: 2021-04-01
  Administered 2021-04-01: 25 ug via INTRAVENOUS
  Filled 2021-04-01: qty 2

## 2021-04-01 MED ORDER — PHENYLEPHRINE HCL-NACL 25-0.9 MG/250ML-% IV SOLN
INTRAVENOUS | Status: DC | PRN
Start: 2021-04-01 — End: 2021-04-01
  Administered 2021-04-01: .4 ug/kg/min via INTRAVENOUS

## 2021-04-01 MED ORDER — LIDOCAINE HCL (PF) 2 % IJ SOLN
INTRAMUSCULAR | Status: AC
Start: 2021-04-01 — End: ?
  Filled 2021-04-01: qty 4

## 2021-04-01 MED ORDER — ONDANSETRON HCL 4 MG/2ML IJ SOLN
INTRAMUSCULAR | Status: DC | PRN
Start: 2021-04-01 — End: 2021-04-01
  Administered 2021-04-01: 4 mg via INTRAVENOUS

## 2021-04-01 MED ORDER — NEOSTIGMINE METHYLSULFATE 5 MG/10ML IV SOLN
INTRAVENOUS | Status: AC
Start: 2021-04-01 — End: ?
  Filled 2021-04-01: qty 10

## 2021-04-01 MED ORDER — SENNOSIDES 8.6 MG OR TABS
17.2000 mg | ORAL_TABLET | Freq: Every evening | ORAL | Status: DC
Start: 2021-04-01 — End: 2021-04-05
  Administered 2021-04-01 – 2021-04-04 (×4): 17.2 mg via ORAL
  Filled 2021-04-01 (×4): qty 2

## 2021-04-01 MED ORDER — VANCOMYCIN HCL IN DEXTROSE 1-5 GM/200ML-% IV SOLN
1.0000 g | INTRAVENOUS | Status: AC
Start: 2021-04-01 — End: 2021-04-01
  Administered 2021-04-01: 1 g via INTRAVENOUS
  Filled 2021-04-01: qty 200

## 2021-04-01 MED ORDER — BUPIVACAINE HCL (PF) 0.25 % IJ SOLN
INTRAMUSCULAR | Status: AC
Start: 2021-04-01 — End: ?
  Filled 2021-04-01: qty 30

## 2021-04-01 MED ORDER — SODIUM CHLORIDE 0.9 % IR SOLN
Status: DC | PRN
Start: 2021-04-01 — End: 2021-04-01
  Administered 2021-04-01: 1000 mL

## 2021-04-01 MED ORDER — DEXAMETHASONE SODIUM PHOSPHATE 4 MG/ML IJ SOLN
INTRAMUSCULAR | Status: DC | PRN
Start: 2021-04-01 — End: 2021-04-01
  Administered 2021-04-01: 4 mg via INTRAVENOUS

## 2021-04-01 MED ORDER — FENTANYL CITRATE (PF) 100 MCG/2ML IJ SOLN
12.5000 ug | INTRAMUSCULAR | Status: DC | PRN
Start: 2021-04-01 — End: 2021-04-01

## 2021-04-01 MED ORDER — POLYETHYLENE GLYCOL 3350 17 G OR PACK
17.0000 g | PACK | Freq: Every day | ORAL | Status: DC
Start: 2021-04-01 — End: 2021-04-05
  Administered 2021-04-01 – 2021-04-05 (×5): 17 g via ORAL
  Filled 2021-04-01 (×5): qty 1

## 2021-04-01 MED ORDER — ONDANSETRON HCL 4 MG/2ML IJ SOLN
INTRAMUSCULAR | Status: AC
Start: 2021-04-01 — End: ?
  Filled 2021-04-01: qty 2

## 2021-04-01 MED ORDER — ACETAMINOPHEN 10 MG/ML IV SOLN
1000.0000 mg | Freq: Once | INTRAVENOUS | Status: AC
Start: 2021-04-01 — End: 2021-04-01
  Administered 2021-04-01: 1000 mg via INTRAVENOUS
  Filled 2021-04-01: qty 100

## 2021-04-01 MED ORDER — NEOSTIGMINE METHYLSULFATE 5 MG/10ML IV SOLN
INTRAVENOUS | Status: DC | PRN
Start: 2021-04-01 — End: 2021-04-01
  Administered 2021-04-01: 1 mg via INTRAVENOUS

## 2021-04-01 MED ORDER — LACTATED RINGERS BOLUS
500.0000 mL | Freq: Once | INTRAVENOUS | Status: DC | PRN
Start: 2021-04-01 — End: 2021-04-01

## 2021-04-01 MED ORDER — METOCLOPRAMIDE HCL 5 MG/ML IJ SOLN
5.0000 mg | Freq: Once | INTRAMUSCULAR | Status: DC | PRN
Start: 2021-04-01 — End: 2021-04-01

## 2021-04-01 MED ORDER — GLYCOPYRROLATE 0.2 MG/ML IJ SOLN
0.2000 mg | INTRAMUSCULAR | Status: DC | PRN
Start: 2021-04-01 — End: 2021-04-01

## 2021-04-01 MED ORDER — DEXAMETHASONE SODIUM PHOSPHATE 4 MG/ML IJ SOLN
INTRAMUSCULAR | Status: AC
Start: 2021-04-01 — End: ?
  Filled 2021-04-01: qty 1

## 2021-04-01 MED ORDER — PHENYLEPHRINE HCL-NACL 1-0.9 MG/10ML-% IV SOSY
PREFILLED_SYRINGE | INTRAVENOUS | Status: DC | PRN
Start: 2021-04-01 — End: 2021-04-01
  Administered 2021-04-01 (×3): 50 ug via INTRAVENOUS
  Administered 2021-04-01: 100 ug via INTRAVENOUS

## 2021-04-01 MED ORDER — FENTANYL CITRATE (PF) 50 MCG/ML IJ SOLN WRAPPER (ANESTHESIA OSM ONLY)
INTRAMUSCULAR | Status: DC | PRN
Start: 2021-04-01 — End: 2021-04-01
  Administered 2021-04-01 (×2): 50 ug via INTRAVENOUS

## 2021-04-01 MED ORDER — HYDROMORPHONE HCL 1 MG/ML IJ SOLN
0.2000 mg | INTRAMUSCULAR | Status: DC | PRN
Start: 2021-04-01 — End: 2021-04-01

## 2021-04-01 MED ORDER — EPHEDRINE SULFATE (PRESSORS) 50 MG/ML IV SOLN
INTRAVENOUS | Status: AC
Start: 2021-04-01 — End: ?
  Filled 2021-04-01: qty 1

## 2021-04-01 MED ORDER — PROPOFOL 10 MG/ML IV EMUL WRAPPER (OSM ONLY)
INTRAVENOUS | Status: DC | PRN
Start: 2021-04-01 — End: 2021-04-01
  Administered 2021-04-01: 50 mg via INTRAVENOUS
  Administered 2021-04-01: 30 mg via INTRAVENOUS
  Administered 2021-04-01 (×2): 20 mg via INTRAVENOUS
  Administered 2021-04-01: 30 mg via INTRAVENOUS

## 2021-04-01 MED ORDER — BUPIVACAINE HCL (PF) 0.25 % IJ SOLN
INTRAMUSCULAR | Status: DC | PRN
Start: 2021-04-01 — End: 2021-04-01
  Administered 2021-04-01: 10 mL via INTRAMUSCULAR

## 2021-04-01 MED ORDER — ONDANSETRON HCL 4 MG/2ML IJ SOLN
4.0000 mg | INTRAMUSCULAR | Status: DC | PRN
Start: 2021-04-01 — End: 2021-04-01

## 2021-04-01 MED ORDER — PROPOFOL 200 MG/20ML IV EMUL
INTRAVENOUS | Status: AC
Start: 2021-04-01 — End: ?
  Filled 2021-04-01: qty 20

## 2021-04-01 SURGICAL SUPPLY — 38 items
BANDAGE MATRIX 15YDX6IN MED ELASTIC STERILE (Dressing) ×2 IMPLANT
CAP TIBIAL TI 0MM EXTENSION ×2 IMPLANT
CUFF TOURNIQUET GRN 24X4IN ATS CYLINDER 1 PORT 1 BLADDER POS LOCK CONNECT 750 (Other) ×2 IMPLANT
DRAPE 3/4 SHEET 53 X 77 STERILE (Drape) ×2 IMPLANT
DRAPE 76INX54IN IMPERVIOUS SPLIT (Drape) ×2 IMPLANT
DRAPE EQUIP C-ARMOR EXPAND C ARM FLUOROSCOPE (Drape) ×2 IMPLANT
DRAPE EXTREMITY T-DRAPE PROXIMA 128INX121INX90IN STERILE (Drape) ×2 IMPLANT
DRESSING GAUZE STERILE 10S 16 PLY 4X4 (Sponge) ×2 IMPLANT
DRESSING PETROLATUM CURAD XEROFORM 9INX5IN (Dressing) ×2 IMPLANT
DRILL BIT 3.2MM 330MM CALIBRATE 100MM (Bit) ×2 IMPLANT
DRILL BIT 4.2MM 145MM QCK COUPLING 3FLUTE (Bit) ×2 IMPLANT
DRILL BIT 4.2MM 330MM QCK COUPLING 3FLUTE (Bit) ×2 IMPLANT
GUIDEWIRE 1.25MM 150MM NONTHREAD (Guidewire) ×4 IMPLANT
LINEN PACK SURGICAL (Other) ×2 IMPLANT
NAIL EXPERT  11MM TITANIUM CANNULATED TIBIAL W/PROXIMAL BEND 315MM STERILE (Nail) ×2 IMPLANT
PACK GENERAL (Pack) ×2 IMPLANT
PAD ESURG GRNDING UNIV PREATTACH CORD SPLIT (Other) ×2 IMPLANT
PADDING CAST WEBRIL COTTON 4YDX4IN UNDERCAST STERILE (Dressing) ×2 IMPLANT
PADDING CAST WEBRIL COTTON 4YDX6IN UNDERCAST STERILE (Dressing) ×2 IMPLANT
ROD REAMING 950MM 2.5MM BALL TIP STERILE (Other) ×2 IMPLANT
SCREW 5MM 34MM SELF TAP 04.005.524S (Screw) ×2 IMPLANT
SCREW 5MM 42MM SELF TAP 04.005.532 (Screw) ×2 IMPLANT
SCREW CANNULATED LONG THREAD 4 X 72MM (Screw) ×4 IMPLANT
SCREW TIBIAL 5MM 32MM SELF TAP 04.005.522 (Screw) ×2 IMPLANT
SCREW TIBIAL 5MM 50MM SELF TAP (Screw) ×2 IMPLANT
SCREW TIBIAL 5MM 55MM SELF TAP 04.015.545 (Screw) ×2 IMPLANT
SCREW TIBIAL 5MM 60MM SELF TAP 04.015.550 (Screw) ×2 IMPLANT
SLEEVE PROTECTION 12MM SUPRAPATELLAR STERILE (Other) ×2 IMPLANT
SOLUTION SURG PREP 26ML DURAPREP 74% ISOPROPYL ALC (Prep) ×4 IMPLANT
SPONGE GAUZE 4INX4IN STERILE 12PLY MEDLINE (Sponge) ×4 IMPLANT
STOCKING TED REG LG KNEE LEN (Other) ×1 IMPLANT
SUTURE BIOSYN 3-0 P-14 30IN UNDYED (Suture) ×2 IMPLANT
SUTURE MONOSOF 3-0 C-14 18IN BLACK (Suture) ×10 IMPLANT
SUTURE POLYSORB 0 GS-10 30IN UNDYED (Suture) ×2 IMPLANT
SUTURE POLYSORB 3-0 V-20 30IN UNDYED (Suture) ×2 IMPLANT
TOWEL SURG PACK (Towel) ×6 IMPLANT
WASHER ORTHO SS 7MM (Washer) ×2 IMPLANT
WRAP COBAN 5YDX3IN SELF ADH TAN (Dressing) ×2 IMPLANT

## 2021-04-01 NOTE — Brief Op Note (Addendum)
Immediate Brief Operative Note    Theresa Giles - DOB: March 09, 1951 (70 year old female) MRN: [id number]  Procedure Date: 04/01/2021      Location:[place] [facility]         PROCEDURE DETAILS    Procedure Team:  Primary: Myra Gianotti, MD  Assisting: Valda Favia, PA-C  Resident - Assisting: Corliss Skains, MD     Procedure(s):   LEFT TIBIA INTRAMEDULLARY ROD INSERTION, OPERATIVE FIXATION TIBIAL PLATEAU EXTENSION WITH CANNULATED 4.0 SCREWS - Left   Pre Procedure Diagnosis:  Closed fracture of proximal end of left tibia, unspecified fracture morphology, initial encounter [S82.102A]     Post Procedure Diagnosis:        * Closed fracture of proximal end of left tibia, unspecified fracture morphology, initial encounter [S82.102A]       PROCEDURE SUMMARY    Anesthesia:  General   Estimated Blood Loss: 150 mL       SPECIMENS:   No specimens were documented in this log.    POST OP PLAN    Toe touch weight bearing   Heparin subQ for DVT prophylaxis while inpatient (not currently getting lovenox due to elevated creatinine   OK to transition to aspirin 81 mg BID at discharge   Vanc x 24 h for prophylaxis    FU with Dr. Tillie Fantasia or Levonne Hubert the week of 5/30 for suture removal

## 2021-04-01 NOTE — Nursing Note (Signed)
Pt came around 2000, sleepy but arousable, tries to get out of bed . Skin check with Martin,RN. New wound on rt cheeck due to ET tube as per report from pacu nurse. Pt and MD aware about it. Currently on 1 l o2. Bedalarm on.

## 2021-04-01 NOTE — Anesthesia Postprocedure Evaluation (Signed)
Patient: Theresa Giles    Procedure Summary     Date: 04/01/21 Room / Location:  [facility] /  [facility]    Anesthesia Start: 1519 Anesthesia Stop: 1834    Procedure: LEFT TIBIA INTRAMEDULLARY ROD INSERTION, OPERATIVE FIXATION TIBIAL PLATEAU EXTENSION WITH CANNULATED 4.0 SCREWS (Left Leg Lower) Diagnosis:       Closed fracture of proximal end of left tibia, unspecified fracture morphology, initial encounter      (Closed fracture of proximal end of left tibia, unspecified fracture morphology, initial encounter [S82.102A])    Surgeons: Myra Gianotti, MD Responsible Provider: Sanjuana Letters, MD    Anesthesia Type: general ASA Status: Not recorded        Final Anesthesia Type: general    Vitals Value Taken Time   BP 127/84 04/01/21 1831   Temp  04/01/21 1834   Pulse 73 04/01/21 1833   SpO2 100 % 04/01/21 1833   Vitals shown include unvalidated device data.    Place of evaluation: PACU    Patient participation: patient participated    Level of consciousness: sedated and responsive to pain    Patient pain control satisfaction: patient is satisfied with level of pain control    Airway patency: patent    Cardiovascular status during assessment: stable    Respiratory status during assessment: breathing comfortably    Anesthetic complications: no    Intravascular volume status assessment: euvolemic    Nausea / vomiting: patient is not experiencing nausea      Planned post-operative disposition at time of assessment: ward care

## 2021-04-01 NOTE — Anesthesia Procedure Notes (Signed)
Airway Placement    Staff:  Performing Provider: Curtis Sites, CRNA  Authorizing Provider: Sanjuana Letters, MD    Airway management:   Patient location: OR/Procedural area  Final airway type: Endotracheal airway  Intubation reason: General anesthesia    Induction:  Positioning: supine  Patient was pre-oxygenated: yes  Mask Ventilation: Grade 2 - Ventilated by mask with oral airway/adjuvant     Intubation:    Final Attempt   Airway Type: ETT  Primary Laryngoscopy: Macintosh  Blade Size: 3  Laryngoscopic View: Grade II  ETT Type: standard, cuffed  ETT Route: oral  Size: 6.5  ETT secured with adhesive tape  Depth at: lips  (19cm)    Number of Attempts: 1    Assessment:  Confirmation: auscultation, waveform capnography and direct visualization  Procedure Abandoned: no    Date / Time Airway Secured / Re-Secured:  04/01/2021 3:30 PM

## 2021-04-01 NOTE — Progress Notes (Addendum)
ORTHOPAEDIC SURGERY COMPARTMENT CHECK?     I evaluated the patient at approximately 2200 on 04/01/21 while maintaining a particular focus on the compartments of the left lower  extremity(ies) given the nature of the injury. Upon evaluation, the patient appeared to be resting in bed, demonstrating no signs of severe discomfort.. Examination as follows:     Left Lower Extremity  Inspection: Ace wrap in place which is clean, dry and intact  Sensory: Sensation intact to light touch: deep peroneal, superficial peroneal, tibial, sural and saphenous nerves  Motor: Fires tibialis anterior, gastrocnemius - soleus complex, extensor hallucis longus and flexor hallucis longus against gravity. No discomfort with active or passive flexion/extension of hallux  Vascular: warm and capillary refill < 2s  Compartments: soft and compressible       I do not suspect the development of complications at this time, but a member of the Orthopaedic Surgery team will return to monitor the examined extremity(ies) at regular intervals throughout the night or sooner if issues arise.?

## 2021-04-01 NOTE — Progress Notes (Signed)
Progress Note     Allanna Hackel ("Viana") - DOB: [birth year] (70 year old female)  Admit Date: 03/29/2021  Code Status: Full Code       CHIEF CONCERN / IDENTIFICATION:  Theresa Giles is a 70 year old female with left tibial fracture     SUBJECTIVE   INTERVAL HISTORY:  -awaiting surgery today  -no SOB, chest pain, N/V  -anemia appears worse, no visible source of bleeding    SCHEDULED MEDICATIONS:     acetaminophen, 1,000 mg, TID    aspirin, 81 mg, Daily    calcium carbonate, 500 mg, Daily with breakfast    cholecalciferol, 2,000 units, Daily    DULoxetine, 30 mg, Daily    ferrous sulfate, 324 mg, Daily    heparin, 5,000 units, q8h [facility]    metoprolol tartrate, 50 mg, BID    multivitamin with minerals, 1 tablet, Daily    oxyCODONE, 5 mg, QID    polyethylene glycol 3350, 17 g, Daily    prenatal multivitamin iron-folic acid, 1 tablet, Daily    QUEtiapine, 25 mg, BID    senna, 17.2 mg, q HS    Vitamin B complex, 1 tablet, Daily    INFUSED MEDICATIONS:       PRN MEDICATIONS:  acetaminophen, 650 mg, q4h PRN    HYDROmorphone, 0.5-1 mg, q4h PRN    melatonin, 3 mg, q HS PRN    ondansetron, 4 mg, q8h PRN    ondansetron, 4 mg, q8h PRN    oxyCODONE, 5 mg, q4h PRN       OBJECTIVE     Vitals (Most recent in last 24 hrs)     T: 36.3 C (04/01/21 0833)  BP: 115/61 (04/01/21 0833)  HR: 66 (04/01/21 0833)  RR: 16 (04/01/21 0833)  SpO2: 99 % (04/01/21 0833) Room air  T range: Temp  Min: 36.3 C  Max: 37 C  Admit weight: 48.7 kg (107 lb 5.8 oz) (03/29/21 1634)  Last weight: 50.3 kg (110 lb 14.3 oz) (03/30/21 0118)       I&Os:     Intake/Output Summary (Last 24 hours) at 04/01/2021 1022  Last data filed at 04/01/2021 0500  Intake 360 ml   Output 875 ml   Net -515 ml       Physical Exam  General:  Elderly appearing, breathing easily, no acute distress.  Eyes:  Anicteric, no conjunctival injection.  ENT:  Normocephalic, atraumatic.  Chest:  Equal breath sounds anteriorly   CV:  Irregularly irregular, no m/r/g  Abdomen:   Soft, non distended, no masses, non tender.  Extremities:  no lower extremity edema.  MS: Left leg in splint  GU: foley  Neuro:  Awake/alert, follows commands, answers questions appropriately.  Fluent speech.  Moving all 4 extremities without difficulty.  Psych:  Calm and cooperative.  Skin:  Forehead abrasion      Labs (last 24 hours):   Chemistries  CBC  LFT  Gases, other   128 100 38 102   8.2   AST: - ALT: -  -/-/-/-   -/-/-/-   4.5 22 1.60   2.90 >< 151  AP: - T bili: -  Lact (a): - Lact (v): -   eGFR: 33 Ca: 9.0   26   Prot: - Alb: -  Trop I: - D-dimer: -   Mg: - PO4: -  ANC: 2.34     BNP: - Anti-Xa: -     ALC: 0.24    INR: -  Reviewed Results? Independently visualized & interpreted? Key Findings     Lab _0  _1     Radiology _2  _3     EKG/Tele/Echo  _4  _5     Other?  _6  _7             ASSESSMENT/PLAN        S/p fall  Left tibial plateau fracture   Likely osteoporotic fracture  Appears mechanical fall  -ortho plans OR today  -Ca and Vit D  -fragility fracture consult  -oxycodone scheduled + PRN    Atrial fibrillation  Outpatient she is taking aspirin and metoprolol  Rate is controlled  -ortho is fine with continuation of aspirin  -continue metoprolol    Acute renal failure  Urinary retention  Suspect pre-renal, also with urinary retention now resolved with foley catheter  -foley  -continue IV fluid x1 day    Severe cervical spondylosis  outpatient follow up.     Macrocytic Anemia  Appears to have inflammatory block, suspect some blood loss from fracture and dilution from IV fluids.  B12 and folate ok.   -monitor HCT    Prophylaxis: heparin

## 2021-04-01 NOTE — Nursing Note (Signed)
Patient Summary  Patient is 70 year old female admitted after a fall resulting in L tibial plateau closed fx. Skin tear on Lt elbow. Abrasion to Lt forehead. Rod insertion on 5/13.    Hx: a-fib, HTN, osteoporosis, falls, chronic pain, multiple orthopedic injuries.     Pt A&Ox3, but forgetful and confused at times. Pt had to be reminded of what time of day it was and to not pull at her IV. Scored a 4 on CIWA for anxiety r/t surgery and a mild headache. VSS, on room air. Pain well controlled with PRN and scheduled oxycodone. Foley in place and draining dark yellow urine. Left leg bruised and edematous, immobilized in long leg splint. IV fluids running at 75 ml/hr. Pt was transported via stretcher down to pre-op for surgery at 1315 and has not yet returned. Report was given to preop RN.  Edited by: Ronald Lobo, RN at 04/01/2021 1619    Illness Severity  Stable  Edited by: Duke Salvia, RN at 03/30/2021 [phone]

## 2021-04-01 NOTE — Progress Notes (Incomplete)
ORTHOPAEDIC SURGERY COMPARTMENT CHECK?     I evaluated the patient at the date and time of service while maintaining a particular focus on the compartments of the left lower  extremity(ies) given the nature of the injury. Upon evaluation, the patient appeared to be resting in bed, demonstrating no signs of severe discomfort.. Examination as follows:     Left Lower Extremity  Inspection: Ace wrap in place to extremity without strikethrough. No strikethrough on dressing.  Sensory: Sensation intact to light touch: deep peroneal, superficial peroneal, tibial, sural and saphenous nerves  Motor: Fires tibialis anterior, gastrocnemius - soleus complex, extensor hallucis longus and flexor hallucis longus against gravity.  Vascular: warm and capillary refill < 2s  Compartments: Anterior, superficial posterior and lateral compartment soft and ea    Right Lower Extremity   Inspection: {Inspection:119411::"No gross deformity. ","No open wounds. "}  Sensory: {Nested Sensory LZJQB:341937}  Motor: {Nested Motor TKWIO:973532}  Vascular: {Vascular:119417::"warm","capillary refill < 2s","2+ radial pulse"}  Compartments: {Extremity Compartments:119418::"soft and compressible"}     I do not suspect the development of complications at this time, but a member of the Orthopaedic Surgery team will return to monitor the examined extremity(ies) at regular intervals throughout the night or sooner if issues arise.?

## 2021-04-01 NOTE — Anesthesia Preprocedure Evaluation (Signed)
Patient: Theresa Giles    Procedure Information     Anesthesia Start Date/Time: 04/01/21 1519    Procedure: LEFT TIBIA INTRAMEDULLARY ROD INSERTION, OPERATIVE FIXATION TIBIAL PLATEAU EXTENSION WITH CANNULATED 4.0 SCREWS (Left Leg Lower)    Location: [place] [facility] / [place] [facility]    Surgeons: Maryann Alar, MD        HPI: Theresa Giles is a 70 yo woman with a left tibial plateau fracture and very poor bone quality scheduled to IM rod insertion.    Relevant Problems   Cardio   (+) A-fib [facility])     Relevant surgical history: No past surgical history on file.      Medications:     Outpatient:   Current Outpatient Medications   Medication Instructions    aspirin 81 mg, Oral, Daily    B Complex Vitamins (VITAMIN B COMPLEX OR) 1 tablet, Oral, Daily    Calcium Carb-Cholecalciferol [id number] MG-UNIT tablet 1 tablet, Oral, Daily    DULoxetine (CYMBALTA) 30 mg, Oral, Daily    furosemide (LASIX) 20 mg, Oral, Daily PRN, Last filled in 2019, per Rx.    iron 325 mg, Oral, Daily    metoprolol tartrate (LOPRESSOR) 50 mg, Oral, 2 times daily    oxyCODONE-acetaminophen 5-325 MG tablet 1 tablet, Oral, Every 6 hours    potassium chloride ER 20 MEQ ER tablet Oral, Daily PRN, Last filled on 2019, per Rx    Prenatal Vit-Fe Fumarate-FA (PRENATAL VITAMIN OR) 1 tablet, Oral, Daily    QUEtiapine (SEROQUEL) 25 mg, Oral, 2 times daily    spironolactone (ALDACTONE) 25 mg, Oral, Every morning    Vitamin D (Cholecalciferol) 25 MCG (1000 UT) capsule 1 capsule, Oral, Daily        Inpatient:   Scheduled     acetaminophen, 1,000 mg, TID    aspirin, 81 mg, Daily    calcium carbonate, 500 mg, Daily with breakfast    cholecalciferol, 2,000 units, Daily    DULoxetine, 30 mg, Daily    ferrous sulfate, 324 mg, Daily    heparin, 5,000 units, q8h [facility]    metoprolol tartrate, 50 mg, BID    multivitamin with minerals, 1 tablet, Daily    oxyCODONE, 5 mg, QID    polyethylene glycol 3350, 17 g, Daily    prenatal multivitamin iron-folic  acid, 1 tablet, Daily    QUEtiapine, 25 mg, BID    senna, 17.2 mg, q HS    Vitamin B complex, 1 tablet, Daily      Continuous  lactated ringers, 50 mL/hr, Continuous    lactated ringers, 100 mL/hr, Continuous    sodium chloride, 75 mL/hr, Continuous, Last Rate: 75 mL/hr (04/01/21 0008)      PRN  acetaminophen, 650 mg, q4h PRN    HYDROmorphone, 0.5-1 mg, q4h PRN    melatonin, 3 mg, q HS PRN    ondansetron, 4 mg, q8h PRN    ondansetron, 4 mg, q8h PRN    oxyCODONE, 5 mg, q4h PRN          Review of patient's allergies indicates:  Allergies   Allergen Reactions    Penicillin V Skin: Rash    Skin Adhesives [Cyanoacrylate] Skin: Rash       Social History:      Medical History and Review of Systems  Documentation reviewed: electronic medical record.    Source of information: In person visit and Chart review.  Previous anesthesia: Yes (general)    History of anesthetic  complications  (+) History of anesthetic complications.  (-) family history of anesthetic complications.    Anesthetic complications comments: Notes problems with adhesive for ETT for prior surgery, took some skin off when removed  Functional Status   Unable to exercise due to physical limitation.       Neuro/Psych 3-4 daily drinks, CIWA protocol in place    Cardiovascular   (+) hypertension  (+) dysrhythmias, atrial fibrillation    Musculoskeletal Severe osteoporosis    GI/Hepatic/Renal   (+) renal disease (likely CKD)    Hematology   (+) anemia            Physical Exam  Airway  Mallampati:  II  TM distance:  >6 cm  Neck ROM:  Full  Mouth Opening:  Normal    Dental   Edentulous upper, lower teeth significantly worn    Cardiovascular  Rhythm:  Irregular  Rate:  Normal   no murmur    Pulmonary  normal    Breath sounds clear to auscultation      Other Physical Exam Findings: Sleepy, dozing off during conversation, appears slightly confused but understands where she is, need for surgery, can tell me what happened.       Labs: (last year)    BMP   CBC/Coags   Na 128 (L) 04/01/2021  Hb 8.2 (L) 04/01/2021   K 4.5 04/01/2021  HCT 26 (L) 04/01/2021   Cl 100 04/01/2021  WBC 2.90 (L) 04/01/2021   HCO3 22 04/01/2021  PLT 151 04/01/2021   BUN 38 (H) 04/01/2021  INR 1.0 03/29/2021   Cr 1.60 (H) 04/01/2021  PT 13.3 03/29/2021   Glu 102 04/01/2021  PTT 31 03/29/2021       Misc   eGFR 33 (L) 04/01/2021  MCV 110 (H) 04/01/2021   A1C - -  BNP - -       LFTs   AST 37 03/29/2021  Albumin 3.9 03/29/2021   ALT 21 03/29/2021  Protein 6.7 03/29/2021   Alk Phos 115 03/29/2021  T Bili 0.8 03/29/2021         Relevant procedures / diagnostic studies: none    PAT CLINIC DISCUSSION    ANESTHESIA PLAN   Informed Consent:     Anesthesia Plan discussed with:        Patient    ASA Score:     ASA: 3  Planned Anesthetic Type:      general      Risk Calculators / Scores:     PONV: Intermediate Risk  Total Score: 2            Female patient     Intended opioid administration        Criteria that do not apply:    Non-smoker    History of PONV    History of motion sickness

## 2021-04-01 NOTE — Progress Notes (Signed)
ORTHOPEDIC SURGERY DAILY PROGRESS NOTE      ADMIT DATE: 03/29/2021     PROCEDURES:   NA    ASSESSMENT:   70 year old female who sustained a L tibial shaft fracture with extension into the proximal tibial articular surface after a mGLF    PLAN:   Activity / WB status: NWB LLE  Antibiotics: None Indicated  Anticoagulation plan: Heparin due to elevated creatinine  Dispo: pending clinical course  Follow up: Pending clinical course.    #L tibial shaft fx  -pending operative fixation, 5/13  -Compartment checks continue to be reassuring    PROBLEM LIST:  Patient Active Problem List    Diagnosis    Left tibial fracture    Tibial plateau fracture, left, closed, initial encounter    A-fib (HCC)          SUBJECTIVE:   Sensation slightly improved today compared to yesterday    VITALS: (Most recent and 24 hour range.)   Temp:  [36.7 C-37.4 C] 36.8 C  Pulse:  [60-105] 60  Resp:  [16-18] 16  BP: (101-123)/(59-74) 102/60  MAP (mmHg):  [78-90] 78  SpO2:  [94 %-99 %] 99 %    PHYSICAL EXAM:   GEN: no acute distress  RESP: no increased WOB  PSYCH: answering questions appropriately  MSK:  LLE  Long leg splint in place  Stable swelling over proximal tibia at site of fracture  Remainder of leg/compartments are soft and compressible  No pain with passive extension or flexion of all toes  Palpable DP pulse  Sensation stably diminished in Sp and DP distribution,   sensation intact to light touch in distribution of sural, saphenous and tibial distribution  Able to flex and extend great toe without pain  Toes warm and well perfused    SCHEDULED MEDS:   acetaminophen, 1,000 mg, Oral, TID  aspirin, 81 mg, Oral, Daily  calcium carbonate, 500 mg, Oral, Daily with breakfast  cholecalciferol, 2,000 units, Oral, Daily  DULoxetine, 30 mg, Oral, Daily  ferrous sulfate, 324 mg, Oral, Daily  heparin, 5,000 units, Subcutaneous, q8h [facility]  metoprolol tartrate, 50 mg, Oral, BID  multivitamin with minerals, 1 tablet, Oral, Daily  oxyCODONE, 5 mg, Oral,  QID  prenatal multivitamin iron-folic acid, 1 tablet, Oral, Daily  QUEtiapine, 25 mg, Oral, BID  Vitamin B complex, 1 tablet, Oral, Daily         PRN MEDS:   acetaminophen, 650 mg, q4h PRN  HYDROmorphone, 0.5-1 mg, q4h PRN  melatonin, 3 mg, q HS PRN  ondansetron, 4 mg, q8h PRN  ondansetron, 4 mg, q8h PRN  oxyCODONE, 5 mg, q4h PRN  senna, 17.2 mg, BID PRN

## 2021-04-01 NOTE — Nursing Note (Signed)
Patient Summary  Patient is 69 year old female admitted after a fall resulting in L tibial plateau closed fx. Skin tear on Lt elbow. Abrasion to Lt forehead.     Hx: a-fib, HTN, osteoporosis, falls, chronic pain, multiple orthopedic injuries.     Pt Is NPO, get IV bolus 75 mL /hr. Pain is managed by scheduled and PRN Oxycodone.

## 2021-04-02 DIAGNOSIS — E871 Hypo-osmolality and hyponatremia: Secondary | ICD-10-CM

## 2021-04-02 LAB — CBC, DIFF
% Basophils: 0 %
% Eosinophils: 0 %
% Immature Granulocytes: 0 %
% Lymphocytes: 8 %
% Monocytes: 9 %
% Neutrophils: 83 %
% Nucleated RBC: 0 %
Absolute Eosinophil Count: 0 10*3/uL (ref 0.00–0.50)
Absolute Lymphocyte Count: 0.43 10*3/uL — ABNORMAL LOW (ref 1.00–4.80)
Basophils: 0 10*3/uL (ref 0.00–0.20)
Hematocrit: 23 % — ABNORMAL LOW (ref 36.0–45.0)
Hemoglobin: 7.2 g/dL — ABNORMAL LOW (ref 11.5–15.5)
Immature Granulocytes: 0 10*3/uL (ref 0.00–0.05)
MCH: 34.3 pg — ABNORMAL HIGH (ref 27.3–33.6)
MCHC: 31.2 g/dL — ABNORMAL LOW (ref 32.2–36.5)
MCV: 110 fL — ABNORMAL HIGH (ref 81–98)
Monocytes: 0.48 10*3/uL (ref 0.00–0.80)
Neutrophils: 4.45 10*3/uL (ref 1.80–7.00)
Nucleated RBC: 0 10*3/uL
Platelet Count: 178 10*3/uL (ref 150–400)
RBC: 2.1 10*6/uL — ABNORMAL LOW (ref 3.80–5.00)
RDW-CV: 14.3 % (ref 11.6–14.4)
WBC: 5.36 10*3/uL (ref 4.3–10.0)

## 2021-04-02 LAB — BASIC METABOLIC PANEL
Anion Gap: 8 (ref 4–12)
Calcium: 8.9 mg/dL (ref 8.9–10.2)
Carbon Dioxide, Total: 19 meq/L — ABNORMAL LOW (ref 22–32)
Chloride: 98 meq/L (ref 98–108)
Creatinine: 1.38 mg/dL — ABNORMAL HIGH (ref 0.38–1.02)
Glucose: 132 mg/dL — ABNORMAL HIGH (ref 62–125)
Potassium: 4.9 meq/L (ref 3.6–5.2)
Sodium: 125 meq/L — ABNORMAL LOW (ref 135–145)
Urea Nitrogen: 34 mg/dL — ABNORMAL HIGH (ref 8–21)
eGFR by CKD-EPI: 39 mL/min/{1.73_m2} — ABNORMAL LOW (ref >59)

## 2021-04-02 MED ORDER — SODIUM CHLORIDE 0.9% IV BOLUS
500.0000 mL | Freq: Once | INTRAVENOUS | Status: AC
Start: 2021-04-02 — End: 2021-04-02
  Administered 2021-04-02: 500 mL via INTRAVENOUS

## 2021-04-02 MED ORDER — SODIUM CHLORIDE 0.9% IV BOLUS
500.0000 mL | Freq: Once | INTRAVENOUS | Status: AC
Start: 2021-04-03 — End: 2021-04-03
  Administered 2021-04-03: 500 mL via INTRAVENOUS

## 2021-04-02 NOTE — Progress Notes (Signed)
Physical Therapy  Physical Therapy Evaluation/Treatment    Patient Name: Theresa Giles  MRN: [id number]  Today's Date: 04/02/2021    General Visit Information  General  Documentation Type: Initial Eval  Treatment Start Time: 0930  Treatment End Time: 1025  Treatment Duration (min): 55 Mins  Family/Caregiver Present: No    Subjective  Subjective  Patient Report/Self-Assessment: Doing okay. No specific complaints  Patient Stated Goal: Would like to D/C to sons home    Physical Therapy Diagnosis  PT Diagnosis: L Tibial Fracture  PT Diagnosis (Continued): L LE NWB    Patient Active Problem List   Diagnosis   . Left tibial fracture   . Tibial plateau fracture, left, closed, initial encounter   . A-fib (HCC)       No past medical history on file.    No past surgical history on file.    Home Living  Home Living  Type of Home: House  Entry Stairs: staying with son in a house with some steps to enter (around 5 with no rail)  Lives With: Alone  Mobility Equipment Owned: Single point cane;Front wheeled or pick-up walker    Prior Level of Function  Prior Function  Prior Level of Function: Independent with all functional mobility and ADLs  Details on Prior Level of Function: Pt lives in a house in [place]. Reports that she is here visiting her son. She reports being independent walking with no AD inside and a single point cane outside  Assistance Available at Home: 24 hour assistance (Son works from home)      Pain  Pain Assessment  Pain Assessment: No/denies pain  Pain Score: 9  Pain Descriptor Scale: Severe  Pain Intensity: Rest  Pain Location: LLE  Pain Orientation: Left  Pain Descriptors: Burning;Aching  Pain Frequency: Constant/continuous  Pain Onset: Ongoing  Pattern Quality of Pain: Continuous  Pain Interventions: Medication (See MAR);Elevated;Splinting  Activities/Procedures Causing Pain: Turning/repositioning  Response to Interventions: Yes  Pain Relief Scale: Slight    Cognition  Cognition  Overall Cognitive Status:  Impaired  Arousal/Alertness: Appropriate responses to stimuli  Orientation Level: Oriented X4  Following Commands: Follows one step commands consistently  Cognition Comments: Pt highly distractable. Extra time needed to get history from Pt. Inconsistant responses to questions    Extremity Assessments                                RLE Assessment  RLE Assessment: Within Functional Limits              LLE Assessment  LLE Assessment: Exceptions to [facility]             Static Sitting Balance  Static Sitting Balance  Static Sitting-Balance Support: No upper extremity supported  Static Sitting Level of Assistance: Supervision / Stand by assistance  Dynamic Sitting Balance  Dynamic Sitting Balance  Dynamic Sitting-Balance Support: No upper extremity supported  Dynamic Sitting Balance Level of Assist: Supervision / Stand by assistance  Static Standing Balance  Static Standing Balance  Static Standing Balance: Bilateral upper extremity supported  Static Standing Balance Level of Assistance: Minimum Assist (less than 25% help needed)  Dynamic Standing Balance     Activity Tolerance  Activity Tolerance  Endurance: Endurance does not limit participation in activity    Bed Mobility  Bed Mobility  Supine>Sit: Minimum Assist (less than 25% help needed)  Sit>Supine: Minimum Assist (less than 25% help needed)  Transfers  Transfers  Sit<>Stand: Maximum Assist (between 50-74% help needed);Assistive device used  Sit<>Stand Assistive Device Details: Front wheeled walker    Outcome Measures     Confusion Assessment Method-ICU (CAM-ICU)  Feature 3: Altered Level of Consciousness: Positive  [place] Agitation Sedation Scale (RASS)  [place] Agitation Sedation Scale (RASS): Agitated    Treatment  Therapeutic Exercise     Therapeutic Activity  Therapeutic Activity  Therapeutic Activity Time Entry: 25  Therapeutic Activity 1: Pt instructed in ankle pumps to prevent blood clots. Educated on weight bearing precautions  Therapeutic Activity 2: bed  mobility and transfer training  Neuromuscular Re-Education     Gait Training     Other Activities       Assessment/Plan  PT Assessment Results: Decreased strength;Decreased range of motion;Decreased endurance;Impaired balance;Decreased mobility  Impact on Function and Participation: Pt is a 70 year-old female admitted due to a L tibial fracture now status post surgery. Pt is TTWB L LE. Pt a bit forgetful and hard to get history from. Reports that she lives in [place] normally but is currently visiting her son here in [place]. He has a house with ~5 steps without rails to enter. Son works from home. Pt uses a SPC out of the home and no device inside. Reports that last summer she broke her femur and L arm and was in a SNF for several months. Just stopped using the walker in January. On eval Pt alert and agreeable to participate. A bit tangentile and difficult to get history out of. Pt required minA for bed mobility to support her L leg. Needs to direct movement. Able to transfer to standing with maxA and stand with FWW long enough to get BP. Not able to take hop steps in place. At this time Pt is far bellow her baseline mobility. Would benefit from ongoing PT in suabcute rehab at D/C to progress towards mobility baseline. Requires 24 hour assistance for all mobility  Prognosis: Good        Treatment/Interventions: Therapeutic exercise;Therapeutic functional activities;Gait/Stairs training;Patient/family training;Equipment eval/education  PT Plan: Skilled PT  PT Dosage: 1-2x/day, 5-6 days/week  PT Discharge Recommendations: Follow up with PT at next level of care (to be determined by primary team)        Equipment Recommended: Front wheeled walker    Barriers to Discharge  Barriers to Discharge: TTWB, level of assist      Goals  STG  Goal: Bed mobility;Independent  Estimated Completion Date: 04/07/21  STG  Goal: Transfer with AD;Contact guard assist (touching/steadying assistance)  Estimated Completion Date:  04/07/21  STG  Goal: Amb with AD;Contact guard assist (touching/steadying assistance)  Estimated Completion Date: 04/07/21                                                 Otilio Jefferson, PT  04/02/2021

## 2021-04-02 NOTE — Nursing Note (Signed)
A/OX4, Very disorganize atimes. Pain controlled with PRN and schedule oxycodone. Foley d'cd @ approx. 0845hrs. No void yet. Pt. Has no urge to void. Bladder scan at 1530hrs read 166cc. P/O intake encouraged. 500 n/s bolus given this shift. Seen by PT/OT. CTM

## 2021-04-02 NOTE — Progress Notes (Signed)
Progress Note     Theresa Giles ("Theresa Giles") - DOB: 07-Sep-1951 (70 year old female)  Admit Date: 03/29/2021  Code Status: Full Code       CHIEF CONCERN / IDENTIFICATION:  Theresa Giles is a 70 year old female with left tibial fracture     SUBJECTIVE   INTERVAL HISTORY:  -Underwent left tibia IMN + ORIF yesterday  -no SOB, chest pain, N/V    SCHEDULED MEDICATIONS:   .  acetaminophen, 1,000 mg, TID  .  aspirin, 81 mg, Daily  .  calcium carbonate, 500 mg, Daily with breakfast  .  cholecalciferol, 2,000 units, Daily  .  DULoxetine, 30 mg, Daily  .  ferrous sulfate, 324 mg, Daily  .  heparin, 5,000 units, q8h [facility]  .  metoprolol tartrate, 50 mg, BID  .  multivitamin with minerals, 1 tablet, Daily  .  oxyCODONE, 5 mg, QID  .  polyethylene glycol 3350, 17 g, Daily  .  prenatal multivitamin iron-folic acid, 1 tablet, Daily  .  QUEtiapine, 25 mg, BID  .  senna, 17.2 mg, q HS  .  sodium chloride, 500 mL, Once  .  Vitamin B complex, 1 tablet, Daily    INFUSED MEDICATIONS:       PRN MEDICATIONS:  acetaminophen, 650 mg, q4h PRN  .  HYDROmorphone, 0.5-1 mg, q4h PRN  .  melatonin, 3 mg, q HS PRN  .  ondansetron, 4 mg, q8h PRN  .  ondansetron, 4 mg, q8h PRN  .  oxyCODONE, 5 mg, q4h PRN       OBJECTIVE     Vitals (Most recent in last 24 hrs)     T: 36.5 C (04/02/21 1230)  BP: (!) 94/59 (04/02/21 1230)  HR: 76 (04/02/21 1230)  RR: 18 (04/02/21 1230)  SpO2: 98 % (04/02/21 1230) Room air  T range: Temp  Min: 36 C  Max: 36.5 C  Admit weight: 48.7 kg (107 lb 5.8 oz) (03/29/21 1634)  Last weight: 50.3 kg (110 lb 14.3 oz) (03/30/21 0118)       I&Os:     Intake/Output Summary (Last 24 hours) at 04/02/2021 1254  Last data filed at 04/02/2021 1200  Intake 2760 ml   Output 1350 ml   Net 1410 ml       Physical Exam  General:  Elderly appearing, breathing easily, no acute distress.  Eyes:  Anicteric, no conjunctival injection.  ENT:  Normocephalic, atraumatic.  Chest:  Equal breath sounds anteriorly   CV:  Irregularly irregular, no m/r/g  Abdomen:   Soft, non distended, no masses, non tender.  Extremities:  no lower extremity edema.  MS: Left leg in splint  Neuro:  Awake/alert, follows commands, answers questions appropriately.  Fluent speech.  Moving all 4 extremities without difficulty.  Psych:  Calm and cooperative.  Skin:  Forehead abrasion      Labs (last 24 hours):   Chemistries  CBC  LFT  Gases, other   125 98 34 132   7.2   AST: - ALT: -  -/-/-/-  -/-/-/-   4.9 19 1.38   5.36 >< 178  AP: - T bili: -  Lact (a): - Lact (v): -   eGFR: 39 Ca: 8.9   23   Prot: - Alb: -  Trop I: - D-dimer: -   Mg: - PO4: -  ANC: 4.45     BNP: - Anti-Xa: -     ALC: 0.43    INR: -  Reviewed Results? Independently visualized & interpreted? Key Findings     Lab '[x]'  '[]'     Radiology '[]'  '[]'     EKG/Tele/Echo  '[]'  '[]'     Other?  '[]'  '[]'             ASSESSMENT/PLAN        S/p fall  Left tibial plateau fracture   Likely osteoporotic fracture  Appears mechanical fall, repaired 5/13  -Ca and Vit D  -fragility fracture consult  -oxycodone scheduled + PRN  -PT/OT    Atrial fibrillation  Outpatient she is taking aspirin and metoprolol  Rate is controlled  -continue aspirin  -continue metoprolol    Acute renal failure  Hyponatremia  Renal failure improved, hyponatremia a bit worse.  Suspect pre-renal  -551m IVF bolus  -recheck BMP tomorrow    Urinary retention  Occurred at time of admission  -voiding trial today    Severe cervical spondylosis  outpatient follow up.     Macrocytic Anemia  Appears to have inflammatory block, suspect some blood loss from fracture and dilution from IV fluids.  B12 and folate ok.   -monitor HCT    Prophylaxis: heparin

## 2021-04-02 NOTE — Progress Notes (Signed)
ORTHOPEDIC SURGERY DAILY PROGRESS NOTE      ADMIT DATE: 03/29/2021     PROCEDURES:     04/01/21 - IMN + ORIF L tibia Theresa Giles)    ASSESSMENT:   70 year old female who sustained a L tibial shaft fracture with extension into the proximal tibial articular surface after a mGLF, now s/p above     PLAN:   Activity / WB status: TTWB LLE  Antibiotics: Vancomycin x24 hour postop for ppx  Anticoagulation plan: Heparin due to elevated creatinine, okay to transition to ASA 81mg  BID at discharge  Dispo: pending clinical course  Follow up: Pending clinical course. FU with Dr. or Theresa Giles the week of 5/30 for suture removal    #L tibial shaft fx  - definitively stabilized, no further operative plans  -weight bearing and follow up as above   - remains soft and compressible, no concern for compartment syndrome     PROBLEM LIST:  Patient Active Problem List    Diagnosis   . Left tibial fracture   . Tibial plateau fracture, left, closed, initial encounter   . A-fib (HCC)          SUBJECTIVE:   Sensation stable to prior. Pain well controlled.     VITALS: (Most recent and 24 hour range.)   Temp:  [36 C-36.5 C] 36 C  Pulse:  [55-89] 69  Resp:  [10-18] 18  BP: (95-154)/(61-110) 114/68  MAP (mmHg):  [72-121] 83  SpO2:  [95 %-100 %] 100 %    PHYSICAL EXAM:   GEN: no acute distress  RESP: no increased WOB  PSYCH: answering questions appropriately  MSK:  LLE  Ace wrap in place w/o strikethrough  Compartments are soft and compressible  No pain with passive extension or flexion of all toes  Palpable DP pulse  Sensation stably diminished in Sp and DP distribution,   sensation intact to light touch in distribution of sural, saphenous and tibial distribution. Stable to prior.   Able to flex and extend great toe without pain  Toes warm and well perfused    SCHEDULED MEDS:   acetaminophen, 1,000 mg, Oral, TID  aspirin, 81 mg, Oral, Daily  calcium carbonate, 500 mg, Oral, Daily with breakfast  cholecalciferol, 2,000 units, Oral,  Daily  DULoxetine, 30 mg, Oral, Daily  ferrous sulfate, 324 mg, Oral, Daily  heparin, 5,000 units, Subcutaneous, q8h [facility]  metoprolol tartrate, 50 mg, Oral, BID  multivitamin with minerals, 1 tablet, Oral, Daily  oxyCODONE, 5 mg, Oral, QID  polyethylene glycol 3350, 17 g, Oral, Daily  prenatal multivitamin iron-folic acid, 1 tablet, Oral, Daily  QUEtiapine, 25 mg, Oral, BID  senna, 17.2 mg, Oral, q HS  Vitamin B complex, 1 tablet, Oral, Daily         PRN MEDS:   acetaminophen, 650 mg, q4h PRN  HYDROmorphone, 0.5-1 mg, q4h PRN  melatonin, 3 mg, q HS PRN  ondansetron, 4 mg, q8h PRN  ondansetron, 4 mg, q8h PRN  oxyCODONE, 5 mg, q4h PRN

## 2021-04-02 NOTE — Nursing Note (Signed)
Patient is 70 year old female admitted after a fall resulting in L tibial plateau closed fx. Skin tear on Lt elbow. AXOX 1-2. Follows command. Took all med whole with water without any difficulty.Pt tried to remove iV line and saturation probe, hence wrapped with ACE wrap. Spoke with her son yesterday and as per son, pt is in her baseline. Foley's draining well. Dressing CDI. CMS intact. Edema present on left leg. CIWA scored 4. Fall mat placed. Bed alarm on. IV @100  ml.

## 2021-04-02 NOTE — Progress Notes (Signed)
Occupational Therapy Evaluation/Treatment     Patient Name: Theresa Giles  MRN: [id number]  Today's Date: 04/02/2021    General Visit Information  General  Documentation Type: Initial Eval  Treatment Start Time: 1040  Treatment End Time: 1130  Treatment Duration (min): 50 Minutes  Family/Caregiver Present: No  OT Last Visit  OT Received On: 04/02/21  OT Diagnosis: decreased adl, fc mob, ttwb on left.    Patient Active Problem List   Diagnosis   . Left tibial fracture   . Tibial plateau fracture, left, closed, initial encounter   . A-fib (HCC)       No past medical history on file.    No past surgical history on file.    Subjective  Subjective  Patient Report/Self-Assessment: Pain: 7-8, left lower leg  Patient Stated Goal: Home to [facility] Living  Home Living  Type of Home: House (at son's in [place], pt lives in [place])  Entry Stairs: 1 step and platform, and one addl stair.  Indoor Stairs: one flight down to mother in law suite: bed/bath.  Lives With: Son  Bathroom Shower/Tub: Walk-in shower (bathseat avail.)  Bathroom Toilet: Standard (reports will have to get a rts or bsc for son's.)  Mobility Equipment Owned: Front wheeled or pick-up walker (fww in [place], has spc here.)    Prior Function  Prior Function  BADLs: Independent  IADLs: Independent (delivered groceries.)  Functional Mobility: Independent  Type of Occupation: retired Airline pilot.  Assistance Available at Home: 24 hour assistance (son avail, works from home.)      Extremity Assessments  RUE Assessment  RUE Assessment: Within Functional Limits                       LUE Assessment  LUE Assessment: Within Functional Limits                                Static Sitting Balance  Static Sitting Balance  Static Sitting Level of Assistance: Independent  Dynamic Sitting Balance  Dynamic Sitting Balance  Dynamic Sitting Balance Level of Assist: Independent  Static Standing Balance  Static Standing Balance  Static Standing Balance Level of Assistance: Contact  guard assist (touching/steadying assistance) (fww)  Dynamic Standing Balance  Dynamic Standing Balance  Dynamic Standing Balance Level of Assist: Minimum Assist (less than 25% help needed) (fww)    Self-Care/Home Management  Self-Care/Home Management  Time Entry (min): 25 Minutes  Lower Body Dressing: Supervision / Stand by assistance;Position- sitting (threading depends. min to pull up supine performing bridge with cues.)  Putting on/taking off footwear: Maximum Assist (between 50-74% help needed) (bilat bedside.)  Toileting: Other (Comment) (tx to bsc, unable to void, would need assist with clothing management.)    Cognition  Cognition  Overall Cognitive Status: Impaired  Arousal/Alertness: Delayed responses to stimuli  Orientation Level: Oriented to person;Disoriented to place;Disoriented to time;Disoriented to situation  Following Commands: Follows one step commands with increased time  Cognition Comments: Distractible, inconstistent responses. Needs simple clarification.  Assist for some problem solving/reasoning.         Bed Mobility  Bed Mobility  Rolling Left: Supervision / Stand by assistance;Railings used;Cues needed  Rolling Right: Supervision / Stand by assistance;Cues needed;Railings used  Supine>Sit: Minimum Assist (less than 25% help needed) (hob up. with left le.)  Sit>Supine: Minimum Assist (less than 25% help needed) (with left le.)  Transfers  Transfers  Sit<>Stand: Contact guard assist (touching/steadying assistance) (fww, after instruction of prec, and procedure.)  Toilet Transfer: Minimum Assist (less than 25% help needed) (fww, for balance, pt able to hop step to the right, swivels on right foot to tx left btb. Cues.)     Activity Tolerance  Activity Tolerance  Endurance: Endurance does not limit participation in activity  Activity Tolerance Comments: Reports fatigue after session,rest breaks during req.    AMPAC  AMPAC  AMPAC Assessment Needed?: Yes  Help from another person eating meals:  None  Help from another person taking care of personal grooming: A Little  Help from another person to put on/take off upper body clothing: A Little  Help from another person to put on/take off lower body clothing: A Lot  Help from another person toileting: A Lot  Help from another person bathing: A Lot  Total score: 16         Assessment/Plan  OT Assessment Results: Impaired ADL status;Impaired functional mobility;Decreased endurance/activity tolerated;Impaired cognition  Impact on Function and Participation: Pt admitted with tibial plateau fx, with intramedullary rod insertion and fixation tib plateau extension with cannulated screws. TTWB left le. PLOF; pt distractible, with history, changed responses several times. Reports lives in [place], alone, and goal is to return home. Hopes to d/c to son's after hospitalization, later reports thinks she should go to a 'good' subacute. Pt resides on the lower level at son's with wis, bathseat, standard toilet/vanity. Was ind pta. Has fww in [place]. CLOF: min bed mob, min to stand with fww, min for stand/hop tx to bsc with fww to bsc right, and swivel tx left to eob. Able to thread pants, but would need assist to pull up pants when standing (performed today bedside with min). Anticipate pt will need 24/7 assist for all adl and mob, and ongoing therapies to achieve ind adl and fc mob. Rec subacute. Will cont to follow.  Prognosis: Good  Barriers to Discharge: Decreased caregiver support (below baseline, nwb left le.)  Evaluation/Treatment Tolerance: Patient limited by fatigue    Treatment Interventions: Self-care/Home management;Therapeutic activities     OT Plan: Skilled OT     OT Frequency: 5-6x/week  OT Discharge Recommendations: Follow up with OT at next level of care        Equipment Recommended: Bedside commode    Goals  STG  Goal: Grooming;Upper body dressing;Lower body dressing;Toileting;Toilet transfer;Independent  Estimated Completion Date: 04/16/21                                                                                Carlena Hurl Hagemeyer, OT  04/02/2021

## 2021-04-02 NOTE — Progress Notes (Addendum)
Weekend MSW unable to see pt for assessment. Per MD,Therapy, pt will need SNF at dc. SNF referrals sent. MSW for the unit will f/u for assessment and accepting facilities.

## 2021-04-02 NOTE — Progress Notes (Signed)
ORTHOPAEDIC SURGERY COMPARTMENT CHECK?     I evaluated the patient at approximately 0200 on 04/02/21 while maintaining a particular focus on the compartments of the left lower  extremity(ies) given the nature of the injury. Upon evaluation, the patient appeared to be resting in bed, demonstrating no signs of severe discomfort.. Examination as follows:     Left Lower Extremity  Inspection: Ace wrap in place which is clean, dry and intact  Sensory: Sensation intact to light touch: deep peroneal, superficial peroneal, tibial, sural and saphenous nerves  Motor: Fires tibialis anterior, gastrocnemius - soleus complex, extensor hallucis longus and flexor hallucis longus against gravity. No discomfort with active or passive flexion/extension of hallux  Vascular: warm and capillary refill < 2s  Compartments: soft and compressible       I do not suspect the development of complications at this time, but a member of the Orthopaedic Surgery team will return to monitor the examined extremity(ies) at regular intervals throughout the night or sooner if issues arise.?

## 2021-04-03 DIAGNOSIS — D62 Acute posthemorrhagic anemia: Secondary | ICD-10-CM

## 2021-04-03 LAB — BASIC METABOLIC PANEL
Anion Gap: 5 (ref 4–12)
Calcium: 8.6 mg/dL — ABNORMAL LOW (ref 8.9–10.2)
Carbon Dioxide, Total: 20 meq/L — ABNORMAL LOW (ref 22–32)
Chloride: 99 meq/L (ref 98–108)
Creatinine: 1.62 mg/dL — ABNORMAL HIGH (ref 0.38–1.02)
Glucose: 116 mg/dL (ref 62–125)
Potassium: 4.8 meq/L (ref 3.6–5.2)
Sodium: 124 meq/L — ABNORMAL LOW (ref 135–145)
Urea Nitrogen: 40 mg/dL — ABNORMAL HIGH (ref 8–21)
eGFR by CKD-EPI: 32 mL/min/{1.73_m2} — ABNORMAL LOW (ref >59)

## 2021-04-03 LAB — CBC, DIFF
% Basophils: 0 %
% Eosinophils: 7 %
% Immature Granulocytes: 0 %
% Lymphocytes: 24 %
% Monocytes: 12 %
% Neutrophils: 57 %
% Nucleated RBC: 1 %
Absolute Eosinophil Count: 0.3 10*3/uL (ref 0.00–0.50)
Absolute Lymphocyte Count: 1.07 10*3/uL (ref 1.00–4.80)
Basophils: 0.01 10*3/uL (ref 0.00–0.20)
Hematocrit: 21 % — ABNORMAL LOW (ref 36.0–45.0)
Hemoglobin: 6.7 g/dL — ABNORMAL LOW (ref 11.5–15.5)
Immature Granulocytes: 0.01 10*3/uL (ref 0.00–0.05)
MCH: 34.7 pg — ABNORMAL HIGH (ref 27.3–33.6)
MCHC: 31.9 g/dL — ABNORMAL LOW (ref 32.2–36.5)
MCV: 109 fL — ABNORMAL HIGH (ref 81–98)
Monocytes: 0.55 10*3/uL (ref 0.00–0.80)
Neutrophils: 2.59 10*3/uL (ref 1.80–7.00)
Nucleated RBC: 0.04 10*3/uL — ABNORMAL HIGH
Platelet Count: 192 10*3/uL (ref 150–400)
RBC: 1.93 10*6/uL — ABNORMAL LOW (ref 3.80–5.00)
RDW-CV: 14.5 % — ABNORMAL HIGH (ref 11.6–14.4)
WBC: 4.53 10*3/uL (ref 4.3–10.0)

## 2021-04-03 LAB — PREPARE RBC: Units Ordered: 2

## 2021-04-03 MED ORDER — ASPIRIN 81 MG OR TBEC
81.0000 mg | DELAYED_RELEASE_TABLET | Freq: Two times a day (BID) | ORAL | Status: DC
Start: 2021-04-03 — End: 2021-04-05
  Administered 2021-04-03 – 2021-04-05 (×4): 81 mg via ORAL
  Filled 2021-04-03 (×4): qty 1

## 2021-04-03 MED ORDER — SODIUM CHLORIDE 0.9% IV BOLUS
1000.0000 mL | Freq: Once | INTRAVENOUS | Status: AC
Start: 2021-04-03 — End: 2021-04-03
  Administered 2021-04-03: 1000 mL via INTRAVENOUS

## 2021-04-03 NOTE — Progress Notes (Signed)
ORTHOPEDIC SURGERY DAILY PROGRESS NOTE      ADMIT DATE: 03/29/2021     PROCEDURES:     04/01/21 - IMN + ORIF L tibia Tillie Fantasia)    ASSESSMENT:   70 year old female who sustained a L tibial shaft fracture with extension into the proximal tibial articular surface after a mGLF, now s/p above     PLAN:   Activity / WB status: TTWB LLE  Antibiotics: Vancomycin x24 hour postop for ppx  Anticoagulation plan: Heparin due to elevated creatinine, okay to transition to ASA 81mg  BID at discharge x 6 weeks   Dispo: pending clinical course  Follow up: Pending clinical course. FU with Dr. or Tillie Fantasia the week of 5/30 for suture removal    #L tibial shaft fx  - definitively stabilized, no further operative plans  -weight bearing and follow up as above   - remains soft and compressible, no concern for compartment syndrome     PROBLEM LIST:  Patient Active Problem List    Diagnosis   . Left tibial fracture   . Tibial plateau fracture, left, closed, initial encounter   . A-fib (HCC)          SUBJECTIVE:   Sensation stable to prior. Pain well controlled. Anticipating PT.     VITALS: (Most recent and 24 hour range.)   Temp:  [36.5 C-36.7 C] 36.6 C  Pulse:  [62-76] 62  Resp:  [16-18] 16  BP: (94-120)/(59-69) 108/64  MAP (mmHg):  [70-87] 78  SpO2:  [98 %-99 %] 98 %    PHYSICAL EXAM:   GEN: no acute distress  RESP: no increased WOB  PSYCH: answering questions appropriately  MSK:  LLE  Ace wrap in place w/o strikethrough  Compartments are soft and compressible  No pain with passive extension or flexion of all toes  Palpable DP pulse  Sensation stably diminished in Sp and DP distribution,   sensation intact to light touch in distribution of sural, saphenous and tibial distribution. Stable to prior.   Able to flex and extend great toe without pain  Toes warm and well perfused    SCHEDULED MEDS:   acetaminophen, 1,000 mg, Oral, TID  aspirin, 81 mg, Oral, Daily  calcium carbonate, 500 mg, Oral, Daily with breakfast  cholecalciferol, 2,000  units, Oral, Daily  DULoxetine, 30 mg, Oral, Daily  ferrous sulfate, 324 mg, Oral, Daily  heparin, 5,000 units, Subcutaneous, q8h SCH  metoprolol tartrate, 50 mg, Oral, BID  multivitamin with minerals, 1 tablet, Oral, Daily  oxyCODONE, 5 mg, Oral, QID  polyethylene glycol 3350, 17 g, Oral, Daily  prenatal multivitamin iron-folic acid, 1 tablet, Oral, Daily  QUEtiapine, 25 mg, Oral, BID  senna, 17.2 mg, Oral, q HS  sodium chloride, 1,000 mL, Intravenous, Once  Vitamin B complex, 1 tablet, Oral, Daily         PRN MEDS:   acetaminophen, 650 mg, q4h PRN  melatonin, 3 mg, q HS PRN  ondansetron, 4 mg, q8h PRN  ondansetron, 4 mg, q8h PRN  oxyCODONE, 5 mg, q4h PRN

## 2021-04-03 NOTE — Nursing Note (Signed)
70 y/o M with hx/o Afib,HTN,chronic pain...,admitted after fall s/p Left Tibia IM rod insertion 5/13. A/Ox2, appears confused. LLE surgical site in ACE wrap CDI/CMS+. Vitals stable, afebrile. Pain is adequate managed with routine oxycodone, encourage PO intake, additional 500 ml bolus given at noc. Bladder scan at 00:30am =359ml, voided at 3 am= 200 ml. Bladder scan at 0545 am=228 ml. WCTM.

## 2021-04-03 NOTE — Nursing Note (Signed)
A/OX3, Pain controlled with oxycodone. Voids using bedside commode. Two person assist. LLE precautions maintained per orders. Received 2 units PRBC. Last unit completed at 1645hrs. No s/s of transfusion reaction noted. PVR @ 1745hrs read 250cc. Pt. Denies pain or pressure around lower abd.  MD notified. CTM

## 2021-04-03 NOTE — Progress Notes (Signed)
Progress Note     Theresa Giles ("Theresa Giles") - DOB: [birth year] (70 year old female)  Admit Date: 03/29/2021  Code Status: Full Code       CHIEF CONCERN / IDENTIFICATION:  Theresa Giles is a 70 year old female with left tibial fracture     SUBJECTIVE   INTERVAL HISTORY:  -anemia worse this AM, 2 units PRBCs ordered  -IVF bolus ordered for hyponatremia, renal failure  -feels weak, tired this morning  -no SOB, chest pain, N/V    SCHEDULED MEDICATIONS:   .  acetaminophen, 1,000 mg, TID  .  aspirin, 81 mg, BID  .  calcium carbonate, 500 mg, Daily with breakfast  .  cholecalciferol, 2,000 units, Daily  .  DULoxetine, 30 mg, Daily  .  ferrous sulfate, 324 mg, Daily  .  metoprolol tartrate, 50 mg, BID  .  multivitamin with minerals, 1 tablet, Daily  .  oxyCODONE, 5 mg, QID  .  polyethylene glycol 3350, 17 g, Daily  .  prenatal multivitamin iron-folic acid, 1 tablet, Daily  .  QUEtiapine, 25 mg, BID  .  senna, 17.2 mg, q HS  .  Vitamin B complex, 1 tablet, Daily    INFUSED MEDICATIONS:       PRN MEDICATIONS:  acetaminophen, 650 mg, q4h PRN  .  melatonin, 3 mg, q HS PRN  .  ondansetron, 4 mg, q8h PRN  .  ondansetron, 4 mg, q8h PRN  .  oxyCODONE, 5 mg, q4h PRN       OBJECTIVE     Vitals (Most recent in last 24 hrs)     T: 36 C (04/03/21 1100)  BP: 94/60 (04/03/21 1100)  HR: 67 (04/03/21 1100)  RR: 16 (04/03/21 1100)  SpO2: 94 % (04/03/21 1100) Room air  T range: Temp  Min: 36 C  Max: 36.7 C  Admit weight: 48.7 kg (107 lb 5.8 oz) (03/29/21 1634)  Last weight: 50.3 kg (110 lb 14.3 oz) (03/30/21 0118)       I&Os:     Intake/Output Summary (Last 24 hours) at 04/03/2021 1225  Last data filed at 04/03/2021 1022  Intake 2520 ml   Output 450 ml   Net 2070 ml       Physical Exam  General:  Elderly appearing, breathing easily, no acute distress.  Eyes:  Anicteric, no conjunctival injection.  ENT:  Normocephalic, atraumatic.  Chest:  Equal breath sounds anteriorly   CV:  Irregularly irregular, no m/r/g  Abdomen:  Soft, non distended, no  masses, non tender.  Extremities:  no lower extremity edema.  MS: Left leg with ACE wrap  Neuro:  Awake/alert, follows commands, answers questions appropriately.  Fluent speech.  Moving all 4 extremities without difficulty.  Psych:  Calm and cooperative.  Skin:  Forehead abrasion      Labs (last 24 hours):   Chemistries  CBC  LFT  Gases, other   124 99 40 116   6.7   AST: - ALT: -  -/-/-/-  -/-/-/-   4.8 20 1.62   4.53 >< 192  AP: - T bili: -  Lact (a): - Lact (v): -   eGFR: 32 Ca: 8.6   21   Prot: - Alb: -  Trop I: - D-dimer: -   Mg: - PO4: -  ANC: 2.59     BNP: - Anti-Xa: -     ALC: 1.07    INR: -  Reviewed Results? Independently visualized & interpreted? Key Findings     Lab _0  _1     Radiology _2  _3     EKG/Tele/Echo  _4  _5     Other?  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  Still suspect pre-renal state  -1L IVF bolus  -recheck BMP tomorrow    Urinary retention  Occurred at time of admission  -bladder scan qshift    Severe cervical spondylosis  outpatient follow up.     Macrocytic Anemia   Acute blood loss anemia  Appears to have inflammatory block, suspect some blood loss from fracture and dilution from IV fluids.  B12 and folate ok.   -2 unit PRBC transfusion 5/15    Prophylaxis: asa 91m BID    Dispo: SNF

## 2021-04-04 DIAGNOSIS — M80062A Age-related osteoporosis with current pathological fracture, left lower leg, initial encounter for fracture: Secondary | ICD-10-CM

## 2021-04-04 LAB — CBC, DIFF
% Basophils: 0 %
% Eosinophils: 9 %
% Immature Granulocytes: 1 %
% Lymphocytes: 27 %
% Monocytes: 12 %
% Neutrophils: 51 %
% Nucleated RBC: 1 %
Absolute Eosinophil Count: 0.38 10*3/uL (ref 0.00–0.50)
Absolute Lymphocyte Count: 1.19 10*3/uL (ref 1.00–4.80)
Basophils: 0.02 10*3/uL (ref 0.00–0.20)
Hematocrit: 30 % — ABNORMAL LOW (ref 36.0–45.0)
Hemoglobin: 10.1 g/dL — ABNORMAL LOW (ref 11.5–15.5)
Immature Granulocytes: 0.04 10*3/uL (ref 0.00–0.05)
MCH: 32.6 pg (ref 27.3–33.6)
MCHC: 33.3 g/dL (ref 32.2–36.5)
MCV: 98 fL (ref 81–98)
Monocytes: 0.51 10*3/uL (ref 0.00–0.80)
Neutrophils: 2.31 10*3/uL (ref 1.80–7.00)
Nucleated RBC: 0.04 10*3/uL — ABNORMAL HIGH
Platelet Count: 193 10*3/uL (ref 150–400)
RBC: 3.1 10*6/uL — ABNORMAL LOW (ref 3.80–5.00)
RDW-CV: 19.2 % — ABNORMAL HIGH (ref 11.6–14.4)
WBC: 4.45 10*3/uL (ref 4.3–10.0)

## 2021-04-04 LAB — TYPE AND SCREEN
ABO/Rh: O POS
Antibody Screen: NEGATIVE
Expiration Date of Blood Product: 202206052359
Expiration Date of Blood Product: 202206052359
ISBT Product Blood type: 5100
ISBT Product Blood type: 5100
Product Issue Date/Time: 202205151024
Product Issue Date/Time: 202205151402
Unit Division: 0
Unit Division: 0
Unit Type and Rh: O POS
Unit Type and Rh: O POS
Units Ordered: 2

## 2021-04-04 LAB — BASIC METABOLIC PANEL
Anion Gap: 6 (ref 4–12)
Calcium: 8.7 mg/dL — ABNORMAL LOW (ref 8.9–10.2)
Carbon Dioxide, Total: 21 meq/L — ABNORMAL LOW (ref 22–32)
Chloride: 102 meq/L (ref 98–108)
Creatinine: 1.45 mg/dL — ABNORMAL HIGH (ref 0.38–1.02)
Glucose: 94 mg/dL (ref 62–125)
Potassium: 4.6 meq/L (ref 3.6–5.2)
Sodium: 129 meq/L — ABNORMAL LOW (ref 135–145)
Urea Nitrogen: 35 mg/dL — ABNORMAL HIGH (ref 8–21)
eGFR by CKD-EPI: 37 mL/min/{1.73_m2} — ABNORMAL LOW (ref >59)

## 2021-04-04 LAB — SARS-COV-2 (COVID-19) QUALITATIVE RAPID PCR: COVID-19 Coronavirus Qual PCR Result: NOT DETECTED

## 2021-04-04 LAB — COVID-19 CORONAVIRUS QUALITATIVE PCR: COVID-19 Coronavirus Qual PCR Result: NOT DETECTED

## 2021-04-04 MED ORDER — SODIUM CHLORIDE 0.9% IV BOLUS
1000.0000 mL | Freq: Once | INTRAVENOUS | Status: AC
Start: 2021-04-04 — End: 2021-04-04
  Administered 2021-04-04: 1000 mL via INTRAVENOUS

## 2021-04-04 NOTE — Progress Notes (Signed)
Progress Note     [name] ("Theresa Giles") - DOB: August 25, 1951 (70 year old female)  Admit Date: 03/29/2021  Code Status: Full Code       CHIEF CONCERN / IDENTIFICATION:  Theresa Giles is a 70 year old female with left tibial fracture     SUBJECTIVE   INTERVAL HISTORY:  -anemia resolved with PRBC transfusion  -creatinine down trending, sodium up following IVF bolus.   -no SOB, chest pain, N/V    SCHEDULED MEDICATIONS:   .  acetaminophen, 1,000 mg, TID  .  aspirin, 81 mg, BID  .  calcium carbonate, 500 mg, Daily with breakfast  .  cholecalciferol, 2,000 units, Daily  .  DULoxetine, 30 mg, Daily  .  ferrous sulfate, 324 mg, Daily  .  metoprolol tartrate, 50 mg, BID  .  multivitamin with minerals, 1 tablet, Daily  .  oxyCODONE, 5 mg, QID  .  polyethylene glycol 3350, 17 g, Daily  .  prenatal multivitamin iron-folic acid, 1 tablet, Daily  .  QUEtiapine, 25 mg, BID  .  senna, 17.2 mg, q HS  .  Vitamin B complex, 1 tablet, Daily    INFUSED MEDICATIONS:       PRN MEDICATIONS:  acetaminophen, 650 mg, q4h PRN  .  melatonin, 3 mg, q HS PRN  .  ondansetron, 4 mg, q8h PRN  .  ondansetron, 4 mg, q8h PRN  .  oxyCODONE, 5 mg, q4h PRN       OBJECTIVE     Vitals (Most recent in last 24 hrs)     T: 36.8 C (04/04/21 0541)  BP: (!) 146/82 (RN Notifed.) (04/04/21 0541)  HR: 84 (04/04/21 0541)  RR: 16 (04/04/21 0541)  SpO2: 96 % (04/04/21 0541) Room air  T range: Temp  Min: 36.1 C  Max: 36.8 C  Admit weight: 48.7 kg (107 lb 5.8 oz) (03/29/21 1634)  Last weight: 50.3 kg (110 lb 14.3 oz) (03/30/21 0118)       I&Os:     Intake/Output Summary (Last 24 hours) at 04/04/2021 1219  Last data filed at 04/04/2021 1000  Intake 2077 ml   Output 1550 ml   Net 527 ml       Physical Exam  General:  Elderly appearing, breathing easily, no acute distress.  Eyes:  Anicteric, no conjunctival injection.  ENT:  Normocephalic, atraumatic.  Chest:  Equal breath sounds anteriorly   CV:  Irregularly irregular, no m/r/g  Abdomen:  Soft, non distended, no masses, non  tender.  Extremities:  no lower extremity edema.  MS: Left leg with ACE wrap  Neuro:  Awake/alert, follows commands, answers questions appropriately.  Fluent speech.  Moving all 4 extremities without difficulty.  Psych:  Calm and cooperative.  Skin:  Forehead abrasion    Labs (last 24 hours):   Chemistries  CBC  LFT  Gases, other   129 102 35 94   10.1   AST: - ALT: -  -/-/-/-  -/-/-/-   4.6 21 1.45   4.45 >< 193  AP: - T bili: -  Lact (a): - Lact (v): -   eGFR: 37 Ca: 8.7   30   Prot: - Alb: -  Trop I: - D-dimer: -   Mg: - PO4: -  ANC: 2.31     BNP: - Anti-Xa: -     ALC: 1.19    INR: -           Reviewed Results? Independently visualized &  interpreted? Key Findings     Lab _0  _1     Radiology _2  _3     EKG/Tele/Echo  _4  _5     Other?  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  Suspect pre-renal state  -1L IVF bolus  -recheck BMP tomorrow    Urinary retention, resolved  Occurred at time of admission    Severe cervical spondylosis  outpatient follow up.     Macrocytic Anemia   Acute blood loss anemia  Appears to have inflammatory block, suspect some blood loss from fracture and dilution from IV fluids.  B12 and folate ok. S/p 2 unit PRBC transfusion 5/15    Prophylaxis: asa 47m BID    Dispo: SNF

## 2021-04-04 NOTE — Nursing Note (Signed)
Patient Summary  Patient is 70 year old female admitted after a fall resulting in L tibial plateau closed fx. Skin tear on Lt elbow. Abrasion to Lt forehead. Left tibia IM rod insertion, operative Fixatio tibial pleatue extension with cannulated 4 screw (Left Leg LowerRod insertion on 5/13.    Hx: a-fib, HTN, osteoporosis, falls, chronic pain, multiple orthopedic injuries.     Pain on right leg controlled with routine and PRN oxycodone. Patient able to OOB to [facility], voiding well, PVR minimal <100cc,  Dressing CDI in left leg, ace wrraped. CMS intact. Patient deneis N/V, eating well, had BM. A&Ox3, anxious at times, supportive care provided.        Illness Severity  Stable

## 2021-04-04 NOTE — Consults (Signed)
Inpatient consult to Rheumatology - [facility] Osteoporosis Consult  Consult performed by: Laretta Alstrom, ARNP  Consult ordered by: Fortino Sic, MD        [place]: 04/04/2021    PATIENT: Theresa Giles  DATE OF BIRTH: 04-02-1951  [id number]    RHEUMATOLOGY: No name on file.  PCP: Pcp, UnknownD/CC: 70 year old    Chief Complaint   Patient presents with   . Fall       Reason for Referral:  This pleasant 70y.o. woman is referred by Dr. Talbert Cage for evaluation of osteoporosis after sustaining a left tibial plateau fragility fracture.    HPI:   Theresa Giles is a 70 year old woman who hails from [place].  She is here visiting her son.  She is from [facility]  She had a fall while she was at her son's house, missing a step and falling.  She denies any prodromal symptoms such as lightheadedness, dizziness, chest pain or shortness of breath.  While in the emergency room a left tibial plateau fracture was found and she underwent surgical repair on 04/01/2021 with Dr.Unno.  Postoperatively she has been in good pain control.  She needed 2 units of packed red blood cells for a low H&H.    Osteoporosis history:  She tells me she has osteoporosis but has had no recent DEXA scan.  She has never been on any medications to prevent fracture such as oral alendronate, previous HRT, etc.  She has had a number of fragility fractures beginning in 2015 when she broke her hip.  The next year she broke her other hip.  November 2020 she sustained a distal femur fracture (with repair noted on x-ray imaging at [facility]).  She also sustained a distal humerus fracture in December 2020.  At baseline she notes chronic knee pain and takes Percocet every 6 hours.  She drinks 3-4 beers per day.  Quit smoking in 2017 after smoking half to 1 pack/day for many years during which time she stopped and started many times.  Her mother had osteoporosis but no broken hips.  She denies being on any steroid  medication.  No history of diabetes per her report.  No neuropathy, chronic diarrhea.  She was recently told by her primary care physician to cut back on the calcium she has been taking at home as her level was getting too high.  She tells me she is on calcium and vitamin D at baseline.  Unable to quantify oral calcium intake through her diet.  Of note, she plans to get the rest of her teeth pulled.  Discharge status has not been determined, but she will need rehab care.    ROS:   Frequent falls.  Complete ROS was negative except as noted above    Problem List   Diagnosis   . Left tibial fracture   . Tibial plateau fracture, left, closed, initial encounter   . A-fib [facility])      multiple orthopedic surgeries  Intermittent leg edema      Social History   She lives in [place].  Son in [place].  3-4 beers/ day, no history of withdrawal  Chronic opiates for knee pain, denies other recreational drug use.  Quit smoking in 2017 after smoking half to 1 pack/day numerous years.    Family history:  Her mother had metastatic colon cancer  Her mother had osteoporosis; no hip fractures    OUTPATIENT MEDICATIONS:  Current Outpatient Medications   Medication Instructions   . aspirin 81 mg, Oral, Daily   . B Complex Vitamins (VITAMIN B COMPLEX OR) 1 tablet, Oral, Daily   . Calcium Carb-Cholecalciferol [phone] MG-UNIT tablet 1 tablet, Oral, Daily   . DULoxetine (CYMBALTA) 30 mg, Oral, Daily   . furosemide (LASIX) 20 mg, Oral, Daily PRN, Last filled in 2019, per Rx.   . iron 325 mg, Oral, Daily   . metoprolol tartrate (LOPRESSOR) 50 mg, Oral, 2 times daily   . oxyCODONE-acetaminophen 5-325 MG tablet 1 tablet, Oral, Every 6 hours   . potassium chloride ER 20 MEQ ER tablet Oral, Daily PRN, Last filled on 2019, per Rx   . Prenatal Vit-Fe Fumarate-FA (PRENATAL VITAMIN OR) 1 tablet, Oral, Daily   . QUEtiapine (SEROQUEL) 25 mg, Oral, 2 times daily   . spironolactone (ALDACTONE) 25 mg, Oral, Every morning   . Vitamin D  (Cholecalciferol) 25 MCG (1000 UT) capsule 1 capsule, Oral, Daily       ALLERGIES:   Penicillin v and Skin adhesives [cyanoacrylate]      Physical Exam:    Vitals:  Pulse: 84 (04/04/21)  Resp: 16 (04/04/21)  BP: (!) 146/82 (RN Notifed.) (04/04/21)  SpO2: 96 % (04/04/21)    Gen: Pleasant, thin, NAD  Skin:  Warm, dry.  Left elbow is covered with a large bandage  HEEENT: Sclera noninjected, nonicteric  Oropharynx: Missing many teeth  Lymphatic: Normal cervical and axillary lymph nodes  Cardiovascular: Irregularly irregular, No murmur.  2+ peripheral pulses. No c/c/e   Respiratory: Breathing easily  Gastrointestinal:  soft, nontender, nondistended.    Neurological: Cranial nerves II-XII grossly intact.  alert and oriented x 3  Psychiatric: Normal mood and affect.      Data review:   Labs:   Labs (last 24 hours):   Chemistries  CBC  LFT  Gases, other   129 102 35 94   10.1   AST: - ALT: -  -/-/-/-  -/-/-/-   4.6 21 1.45   4.45 >< 193  AP: - T bili: -  Lact (a): - Lact (v): -   eGFR: 37 Ca: 8.7   30   Prot: - Alb: -  Trop I: - D-dimer: -   Mg: - PO4: -  ANC: 2.31     BNP: - Anti-Xa: -     ALC: 1.19    INR: -    Vitamin D level 42, magnesium normal    Imaging:   CT Knee wo Contrast Left   Final Result   FINDINGS/IMPRESSION:   1. Moderate lipohemarthrosis.   2. Diffuse demineralization of the bones.   3. Schatzker 6 tibial plateau fracture, with transverse component through the tibial diaphysis and longitudinal component extending into the tibial spine and the medial tibial plateau.   4. Old distal femur fracture with intact lateral screw-plate construct as well as screw traversing the lateral femoral condyle.      '[]'        XR Ankle 3+ Vw Left   Final Result   No fracture detected.  Alignment preserved. Diffusely decreased bone mineral density.  No soft tissue abnormality.        XR Knee 1-2 Vw Left   Final Result   Bones are demineralized.      Acute comminuted transverse fracture through the proximal tibial  diaphysis with longitudinal sagittal extension into the medial tibial plateau and tibial spine. Appearance consistent with Schatzker 6 tibial plateau fracture. Small lipohemarthrosis  of the left knee.         IMPRESSION:  Bobbye Petti is a 70y.o. woman who meets World Health Organization guidelines for osteoporosis, and has sustained a fragility fracture most likely due to an underlying osteoporotic condition involving poor architectural bone properties increasing their risk for fracture.        PLAN:  1. They have been started on calcium 500 mg and vitamin D 2,000 IU daily and this should be continued after discharge for continued bone health.  2. Vitamin D level is normal at 42.  I am unsure how much vitamin D she is taking at home so for now we will keep her on 2000 IU vitamin D daily.  3. I did not schedule a follow-up visit as it sounds like she would like to return to [facility] where she lives as soon as possible.  4. I would recommend she has a baseline DXA scan.  She can have this completed through her primary care physician.   5. Due to the number of fractures she has had I would recommend she have AN outpatient SPEP, PTH.  Of note it looks like she has some CKD with her GFR running 47 here in the hospital.  6. As for medication therapy she is planning to have some teeth removed and this should occur before any and resorptive therapy is started.  Generally we like to wait 3 months afterwards for teeth healing to occur.    7. She would not be a good candidate for oral bisphosphonates.  The dosing regimen is just too difficult to maintain and she would be at risk for erosive ulcerations if she did not take the medicine correctly.  I would highly recommend  IV Reclast (Zoledronic acid) which is a yearly infusion given annually for three years, then consideration for a bone med holiday.  This medication has been used since 2007 to treat osteoporosis and is covered by Medicare.  It strengthens bones by  upwards of 30% and decreases overall fracture risk by 47%.  Discussed potential side effects of a limited flu-like reaction generally lasting less than 24 hours which can occur in up to 10% of people.  Generally this side effect can be mediated by giving 1000 mg of acetaminophen, and a dose of oral calcium prior to the infusion as well as encouraging good hydration.  We also give the infusion over 30 minutes to lessen any potential side effects. Rare side effects of ONJ and AFF were briefly discussed.  8. I asked her to follow-up with her primary care physician.    Thank you kindly for the consult.    Laretta Alstrom, ARNP    CC: PCP  Dr. Rica Records  Phone: [phone]  Fax [phone]

## 2021-04-05 DIAGNOSIS — D539 Nutritional anemia, unspecified: Secondary | ICD-10-CM

## 2021-04-05 LAB — BASIC METABOLIC PANEL
Anion Gap: 7 (ref 4–12)
Calcium: 8.6 mg/dL — ABNORMAL LOW (ref 8.9–10.2)
Carbon Dioxide, Total: 21 meq/L — ABNORMAL LOW (ref 22–32)
Chloride: 103 meq/L (ref 98–108)
Creatinine: 1.28 mg/dL — ABNORMAL HIGH (ref 0.38–1.02)
Glucose: 84 mg/dL (ref 62–125)
Potassium: 4.5 meq/L (ref 3.6–5.2)
Sodium: 131 meq/L — ABNORMAL LOW (ref 135–145)
Urea Nitrogen: 29 mg/dL — ABNORMAL HIGH (ref 8–21)
eGFR by CKD-EPI: 43 mL/min/{1.73_m2} — ABNORMAL LOW (ref >59)

## 2021-04-05 LAB — CBC, DIFF
% Basophils: 1 %
% Eosinophils: 6 %
% Immature Granulocytes: 1 %
% Lymphocytes: 29 %
% Monocytes: 12 %
% Neutrophils: 51 %
% Nucleated RBC: 1 %
Absolute Eosinophil Count: 0.27 10*3/uL (ref 0.00–0.50)
Absolute Lymphocyte Count: 1.37 10*3/uL (ref 1.00–4.80)
Basophils: 0.03 10*3/uL (ref 0.00–0.20)
Hematocrit: 33 % — ABNORMAL LOW (ref 36.0–45.0)
Hemoglobin: 10.8 g/dL — ABNORMAL LOW (ref 11.5–15.5)
Immature Granulocytes: 0.03 10*3/uL (ref 0.00–0.05)
MCH: 32.8 pg (ref 27.3–33.6)
MCHC: 32.9 g/dL (ref 32.2–36.5)
MCV: 100 fL — ABNORMAL HIGH (ref 81–98)
Monocytes: 0.57 10*3/uL (ref 0.00–0.80)
Neutrophils: 2.52 10*3/uL (ref 1.80–7.00)
Nucleated RBC: 0.03 10*3/uL — ABNORMAL HIGH
Platelet Count: 212 10*3/uL (ref 150–400)
RBC: 3.29 10*6/uL — ABNORMAL LOW (ref 3.80–5.00)
RDW-CV: 18.8 % — ABNORMAL HIGH (ref 11.6–14.4)
WBC: 4.79 10*3/uL (ref 4.3–10.0)

## 2021-04-05 MED ORDER — SODIUM CHLORIDE 0.9% IV BOLUS
500.0000 mL | Freq: Once | INTRAVENOUS | Status: DC
Start: 2021-04-05 — End: 2021-04-05

## 2021-04-05 MED ORDER — OXYCODONE HCL 5 MG OR TABS: 5.0000 mg | ORAL_TABLET | Freq: Four times a day (QID) | ORAL | 0 refills | Status: DC

## 2021-04-05 MED ORDER — OXYCODONE HCL 5 MG OR TABS: 5.0000 mg | ORAL_TABLET | ORAL | 0 refills | Status: DC | PRN

## 2021-04-05 NOTE — Op Note (Addendum)
SURGEON:  Myra Gianotti, MD    ASSISTANTS:  Tera Partridge, MD, R5  Levonne Hubert, PA-C     DIAGNOSIS:  Closed fracture of the proximal third tibia diaphysis with intraarticular unicondylar extension into the tibial plateau, left leg.  circumferential hematoma on the proximal left leg      INTERVENTION PERFORMED:  1.  Placement of an intramedullary tibial nail (suprapatellar Synthes Expert nail, 11 mm x 315 mm).  2.  Fixation of tibial plateau fractures with 4.0 cannulated screws (Synthes).    INTERVENTION:  All phases of SCOAP 1-4 were followed for the surgery.  Preoperatively, the patient received vancomycin IV (ALLERGY TO PENICILLIN).  The patient was under general anesthesia, supine on CMAX table with a bump placed under her left sacral area and the left upper extremity folded over her thorax with adequate padding.  The left lower extremity was prepped twice with alcohol and ChloraPrep before being draped in a sterile fashion.  The mobility of the patella was verified prior to committing to suprapatellar nailing.    We proceeded with a suprapatellar approach with an incision placed at the superior pole of the patella and extending proximally over 3 cm.  After the skin incision, the subcutaneous plane was developed and the quadricipital tendon and peritenon were incised en bloc.  The patella was mobilized and some medial parapatellar adhesions were freed using curved Mayo scissors.  The triple cannulated sleeve was applied and the threaded guidewire was advanced by hand under fluoroscopic imaging given the extremely poor quality of the bone.  The entry reamer was also applied by hand.  A terminally bent ball-tipped guidewire was introduced in the tibia in a "center-center" position, although the quasi-absence of cancellous bone made the trajectory of the guide difficult to stabilize.      1. Fixation of a tibial plateau with cannulated screws:  Under fluoroscopic imaging  two 1.25 mm K-wires were placed in the  subchondral bone posterior to the anticipated location of the nail.  Measurement was made, but drilling was not used given the poor quality of the bone.  Two partially threaded 32 mm screws were positioned.     2. Intramedullary nailing of tibia:  We then manually applied reamers into the tibial bone, starting at 8.5 mm and then moving to 10 mm, then 12 mm without encountering any resistance.  An 11 mm nail measuring 315 mm was introduced.  Using the jig proximally two medial to lateral locking 5 mm bolts were placed as well as an oblique bolt and an anterior to posterior bolt using fluoroscopic images.  In the distal tibia using the" perfect circle method" with fluoroscopy, 2 medial to lateral 5 mm locking bolts were placed.  Proximally in the nail the locking cap was introduced in order to convert the first locking bolt-nail to an angle stable construct.  Final fluoroscopic imaging verified the reduction as well as proper placement of implants.      The joint was abundantly irrigated with saline as well as the wounds.  The peritendon and tendon were closed using 0-Vicryl and the subcutaneous plane was closed with 3-0 Vicryl.  The skin on all incisions was closed using nylon 3-0 with a combination of Algoewer-Dinati sutures and simple sutures, avoiding tension as much as possible given the very poor state of soft tissue due to hematoma and brittle skin.  Compartements were supple at the end of the intervention.    A simple dressing was applied with Steri-Strip, Xeroform gauze,  Webril, and Ace wrap.  The surgery was well tolerated by the patient, who was transferred safely from the operating table to her bed before transferring to the recovery room in stable condition.    COMPLICATIONS:  None.    POSTOPERATIVE ORDERS:  Toe touch weightbearing.  DVT prophylaxis with aspirin 162 mg once daily.  Follow up in our clinic at 2 weeks approximately for wound check and suture removal.      I, Myra Gianotti, MD was present  during the entire duration of the surgery and scrubbed until closure.

## 2021-04-05 NOTE — Nursing Note (Signed)
ADT Time: 1320  Transported From: 4S, room 447  To:  SNF, [facility] Method:  Doctor, general practice   Accompanied By:  [facility] attendants  Condition on D/C; Stable  SBAR Report Provided to:    Location of Personal Belongings:  With the aptient      Comments: Patient discharged to SNF, [facility] Discharge instructions, prescriptions and other paper work sent to SNF with the patient. Patient A&Ox3, Stable during discharge. Pain managed, left leg incision with Ace-wrap, CDI, CMS positive .

## 2021-04-05 NOTE — Discharge Summary (Signed)
Discharge Summary     Theresa Giles ("[name]") - DOB: 08-27-1951 (70 year old female)  PCP: Pcp, Unknown   Code Status: Full Code        DATE OF ADMISSION: 03/29/2021  DATE OF DISCHARGE: 04/05/21  DISCHARGE TEAM & ATTENDING: Internal Medicine & Dallas Breeding, MD     ADMISSION DIAGNOSIS: Left tibial plateau fracture     DISCHARGE DIAGNOSIS:   Left tibial plateau fracture   Atrial fibrillation  Acute renal failure  Hyponatremia  Macrocytic Anemia  Urinary retention, resolved    PROBLEMS ADDRESSED DURING THIS HOSPITALIZATION:   Principal Problem:    Tibial plateau fracture, left, closed, initial encounter  Active Problems:    Left tibial fracture    A-fib (HCC)  Resolved Problems:    * No resolved hospital problems. *      DISCHARGE FOLLOW-UP VISITS/APPOINTMENTS:    Upcoming appointments at [facility] Medicine:  Future Appointments   Date Time Provider Department Center   04/13/2021  1:45 PM Visca, Dwain Sarna, PA-C [facility]        Additional [place]  [address] [place]  [place] [address]  [phone]    Follow up in 2 week(s)  Dr. Tillie Fantasia or Levonne Hubert the week of 5/30 for suture removal      PENDING RESULTS THAT REQUIRE FOLLOW-UP (as of this summary):  Pending Labs     No pending labs        ALLERGIES:  Penicillin v and Skin adhesives [cyanoacrylate]      DISCHARGE MEDICATIONS:   Current Discharge Medication List      START taking these medications    Details   acetaminophen 500 MG tablet Take 2 tablets (1,000 mg) by mouth 3 times a day.      calcium carbonate 500 MG chewable tablet Chew and swallow 1 tablet (500 mg) by mouth daily with breakfast.      cholecalciferol 50 MCG (2000 UT) tablet Take 1 tablet (2,000 units) by mouth daily.      !! oxyCODONE 5 MG tablet Take 1 tablet (5 mg) by mouth 4 times a day.  Qty: 10 tablet, Refills: 0      !! oxyCODONE 5 MG tablet Take 1 tablet (5 mg) by mouth every 4 hours as needed for moderate pain or severe pain.  Qty: 10 tablet, Refills:  0      polyethylene glycol 3350 17 g packet Take 1 packet (17 g) by mouth daily. Dissolve in 4-8 ounce water.      senna 8.6 MG tablet Take 2 tablets (17.2 mg) by mouth at bedtime as needed for constipation.       !! - Potential duplicate medications found. Please discuss with provider.      CONTINUE these medications which have CHANGED    Details   aspirin 81 MG EC tablet Take 1 tablet (81 mg) by mouth 2 times a day.         CONTINUE these medications which have NOT CHANGED    Details   B Complex Vitamins (VITAMIN B COMPLEX OR) Take 1 tablet by mouth daily.      DULoxetine 30 MG DR capsule Take 30 mg by mouth daily.      Ferrous Sulfate (iron) 325 (65 Fe) MG tablet Take 325 mg by mouth daily.      metoprolol tartrate 50 MG tablet Take 50 mg by mouth 2 times a day.  Prenatal Vit-Fe Fumarate-FA (PRENATAL VITAMIN OR) Take 1 tablet by mouth daily.      QUEtiapine 25 MG tablet Take 25 mg by mouth 2 times a day.         STOP taking these medications       Calcium Carb-Cholecalciferol [phone] MG-UNIT tablet Comments:   Reason for Stopping:         furosemide 20 MG tablet Comments:   Reason for Stopping:         oxyCODONE-acetaminophen 5-325 MG tablet Comments:   Reason for Stopping:         potassium chloride ER 20 MEQ ER tablet Comments:   Reason for Stopping:         spironolactone 25 MG tablet Comments:   Reason for Stopping:         Vitamin D (Cholecalciferol) 25 MCG (1000 UT) capsule Comments:   Reason for Stopping:               BRIEF ADMISSION HISTORY:   Theresa Giles is a 70 year old female with a fall and left tibial fracture    HOSPITAL COURSE:     S/p fall  Left tibial plateau fracture   Likely osteoporotic fracture  Appears mechanical fall, repaired 5/13.  Toe touch weight bearing, started on Ca and Vit D.  Requiring oxycodone scheduled + PRN.  F/U with orthopedic surgery as above.      Atrial fibrillation  Outpatient she is taking aspirin and metoprolol, rate is controlled.      Acute renal  failure  Hyponatremia  Renal failure and hyponatremia present on admission, suspect pre-renal state.  Both improved following IVF administration, Na+ 131 at discharge.     Urinary retention, resolved  Occurred at time of admission    Severe cervical spondylosis  outpatient follow up needed.     Macrocytic Anemia   Acute blood loss anemia  Appears to have inflammatory block, suspect some blood loss from fracture and dilution from IV fluids.  B12 and folate ok. S/p 2 unit PRBC transfusion 5/15     DISPOSITION:    03 SNF-SKILLED NURSING FACILITY [03]    CONDITION: good     CONSULTS COMPLETED:    ED GENERAL CONSULT  ED GENERAL CONSULT  IP CONSULT TO CASE MANAGEMENT  IP CONSULT TO RHEUMATOLOGY  IP CONSULT TO RHEUMATOLOGY     OPERATIONS/PROCEDURES:  Surgical/Procedural Cases on this Admission     Case IDs Date Procedure Surgeon Location Status    [id number] 04/01/21 LEFT TIBIA INTRAMEDULLARY ROD INSERTION, OPERATIVE FIXATION TIBIAL PLATEAU EXTENSION WITH CANNULATED 4.0 SCREWS Myra Gianotti, MD [facility]          Discharge Orders   Discharge Condition: Improving     Discharge Summary: Enclosed     Admission H&P Valid: Yes     Patient Aware of Diagnosis: Yes     Free of Communicable Disease: Yes     Rehab Potential: Good     Discharge Potential: Length of Stay < 30 Days     Level of Care: Skilled     Activity as Tolerated     Toe touch weight bearing     Weight Bearing Status Toe Touch Weight Bearing    Extremity: Left Lower Extremity    LLE Toe-touch weight-bearing      Physical Therapy Eval and Treat     Occupational Therapy Eval and Treat     Future Lab Orders at SNF   Order  Comments: Labs include: Basic Metabolic Profile in 1 week     Treatment Options: Full Resuscitation     Regular diet     Diet type: Regular diet        DISCHARGE PHYSICAL EXAM:   Vitals (Most recent in last 24 hrs)     T: 36.7 C (04/05/21 0500)  BP: 138/74 (04/05/21 0500)  HR: 88 (04/05/21 0500)  RR: 18 (04/05/21 0500)  SpO2: 94 %  (04/05/21 0500) Room air  T range: Temp  Min: 36 C  Max: 36.7 C  Admit weight: 48.7 kg (107 lb 5.8 oz) (03/29/21 1634)  Last weight: 50.3 kg (110 lb 14.3 oz) (03/30/21 0118)       Physical Exam  General:  Elderly appearing, breathing easily, no acute distress.  Eyes:  Anicteric, no conjunctival injection.  ENT:  Normocephalic, atraumatic.  Chest:  Equal breath sounds anteriorly   CV:  Irregularly irregular, no m/r/g  Abdomen:  Soft, non distended, no masses, non tender.  Extremities:  no lower extremity edema.  MS: Left leg with ACE wrap  Neuro:  Awake/alert, follows commands, answers questions appropriately.  Fluent speech.  Moving all 4 extremities without difficulty.  Psych:  Calm and cooperative.  Skin:  Forehead abrasion    ATTENDING TIME STATEMENT:   I spent more than 30 minutes on hospital discharge day management.    [facility] Medicine physicians mentioned in this note can be reached by calling [employer] at [phone]. If any part of this transcript is missing or to request other transcripts for this patient call [phone]. For online access to patient records enroll in [facility] at [email or web address].

## 2021-04-05 NOTE — Progress Notes (Signed)
Social Work and Care Coordination/Navigator Discharge Note  MSW following for DC to SNF. D/W Dr.Ramenofsky  who indicates that patient is medically stable for DC today. Pt choose to go [place] at [facility].  Sent DC orders, meds, summary, COVID negative test and PASRR to [facility] at  [facility]     via [employer] and scheduled [facility]- BLS for 1:00pm pick up today.     MSW spoke with patient and to provide final DC plan.  Pt was alert and oriented and T/C with son  Beryle Beams / DPOA to provide DC plan .  Pt and son  in agreement with DC plan.   Patient is discharging to [place] at  [facility]  via [employer] at 1:00 pm. Today.      Anticipated Discharge Date: 04/05/2021    Funding: Payor: MEDICARE / Plan: MEDICARE PART A AND B / Product Type: Medicare    Discharge Plan:   SNF   Facility name:  [facility] contact:  Natalie  @ [phone]  Facility address:   [address], [place], [place] [address]  Facility phone:   [phone]  Receiving MD name:    Receiving MD phone:    Home care services?:    Type of home care services:      Contact information for follow-up     Myra Gianotti, MD   Specialty: Orthopedic Surgery    [facility]  [address] [place], [place]  [place] [address]   Phone: [phone]       Next Steps: Follow up in 2 week(s)    Instructions: Dr. Tillie Fantasia or Levonne Hubert the week of 5/30 for suture removal          Discharge Placement Checklist:  Benefits for Post-Discharge Services Verified:  YES   Pre-Authorization for Post-Discharge Services Obtained:  YES   Post-Discharge Benefits Explained to Patient/Family:  Yes   Patient/Family provided with post-discharge information:  Yes   Facility Discharge Checklist Confirmation:  Yes     Discharge Transportation:  Does the patient need discharge transport arranged?:  Yes   Has discharge transport been arranged?:  Yes   Who will be transporting patient?: Yes   How will the patient be transported?:[facility]- BLS    What day  is transport expected?:   04/05/21  What time is the transport expected?:  @ 1:00pm     Comments: Pt is discharging to To  [facility]

## 2021-04-05 NOTE — Discharge Instructions (Signed)
Touch down weight bearing only until directed otherwise by Dr. Tillie Fantasia.     Aspirin 82mg  twice daily for 6 weeks to lower your risk of blood clots.     Okay to remove the ACE wrap 5/18 (Wednesday). Keep incision clean and dry. No soaking the incision. Cover to shower.

## 2021-04-13 ENCOUNTER — Encounter (INDEPENDENT_AMBULATORY_CARE_PROVIDER_SITE_OTHER): Payer: Self-pay | Admitting: Physician Assistant

## 2021-04-13 ENCOUNTER — Ambulatory Visit (INDEPENDENT_AMBULATORY_CARE_PROVIDER_SITE_OTHER): Payer: Medicare Other | Admitting: Physician Assistant

## 2021-04-13 VITALS — BP 148/80 | HR 51

## 2021-04-13 DIAGNOSIS — S82102A Unspecified fracture of upper end of left tibia, initial encounter for closed fracture: Secondary | ICD-10-CM

## 2021-04-14 NOTE — Progress Notes (Signed)
Date of surgery 04/01/2021: Attending surgeon Dr. Myra Gianotti -intramedullary nail left tibial shaft fracture      Theresa Giles is a 70 year old female who comes in for her 2-week postop visit. She is status post IMN left proximal tibial shaft fracture. She states she is doing moderately well with pain and it is controlled on Tylenol and regimented and 5 mg oxycodone every 6 hours with 5 mg oxycodone every 4-6 hours for breakthrough pain.  Patient is on 5 mg oxycodone every 4 for chronic pain. No recent: fevers, chills, illness, numbness or paresthesias. Patient states compliance with Postop protocol has been Nonweightbearing the left lower extremity..    Physical exam:  BP (!) 148/80   Pulse (!) 51   SpO2 98%     Patient sitting in exam room no apparent distress, ANO x3, pleasant affect.  Normal chest expansion.  General appearance unremarkable.    Incisions clean, dry and intact with no evidence of infection  Left knee ROM  Active F/E: 90-5-0    SILT distally  DP pulse is 2+ palpable    Imaging  Intraoperative fluoroscopy reviewed patient exam room      Assessment and Plan:    (S82.102A) Closed fracture of proximal end of left tibia, unspecified fracture morphology, initial encounter  (primary encounter diagnosis)    70 year old female s/p IMN left proximal tibial fracture, doing moderately well at 2 weeks postop.    Sutures were removed without complication.  Patient will remain toe-touch weightbearing in the left lower extremity for another 4 weeks.  Patient to work on rehabilitation with physical therapy and Occupational Therapy at her rehab facility in a nonweightbearing setting.  Patient's paperwork filled out for the facility detailing change in oxycodone schedule to every 4 hours with additional 5 mg as needed every 4 hours for breakthrough pain.  Patient understands and agrees with plan.    All questions concerns answered  Patient to follow-up with Dr. Tillie Fantasia in 4 weeks for clinical evaluation and 2 views  of the left tib-fib weightbearing    Theresa Hubert, PA-C  Teaching Associate  Dept of Orthopaedics and Sports Medicine  Ortho Trauma and Foot & Ankle  [place] [facility]

## 2021-04-22 ENCOUNTER — Other Ambulatory Visit (INDEPENDENT_AMBULATORY_CARE_PROVIDER_SITE_OTHER): Payer: Self-pay | Admitting: Orthopaedic Surgery

## 2021-04-22 DIAGNOSIS — S82102A Unspecified fracture of upper end of left tibia, initial encounter for closed fracture: Secondary | ICD-10-CM

## 2021-05-12 ENCOUNTER — Telehealth (INDEPENDENT_AMBULATORY_CARE_PROVIDER_SITE_OTHER): Payer: Self-pay | Admitting: Orthopaedic Surgery

## 2021-05-12 NOTE — Telephone Encounter (Signed)
Patient s/p IM Nail L Tibia DOS 04/01/2021.    Patient was scheduled to see Dr. Tillie Fantasia on 06/29. Patient states she has moved back to [place] and requested to cancel post-op appointment. Inquired if patient is OK for phone visit and to have x-rays done locally. Patient stated she sees an orthopedic doctor in [place], provided patient with name of x-rays ordered.Patient will speak with clinic in [place] and call back with information and if an x-ray order is needed.    Patient requested op-notes and x-ray reports from [place]. Advised I can mail it to her, confirmed address. Patient will call back with fax number for [facility] office, and will request op-notes, and x-ray report.     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

## 2021-05-18 ENCOUNTER — Encounter (INDEPENDENT_AMBULATORY_CARE_PROVIDER_SITE_OTHER): Payer: Medicare Other | Admitting: Orthopaedic Surgery

## 2021-05-18 ENCOUNTER — Other Ambulatory Visit (INDEPENDENT_AMBULATORY_CARE_PROVIDER_SITE_OTHER): Payer: Medicare Other

## 2021-05-25 ENCOUNTER — Telehealth (INDEPENDENT_AMBULATORY_CARE_PROVIDER_SITE_OTHER): Payer: Medicare Other | Admitting: Orthopaedic Surgery

## 2021-06-06 NOTE — Progress Notes (Signed)
Phone cancelled    Patient s/p IM Nail L Tibia DOS 04/01/2021.    Patient was scheduled to see Dr. Tillie Fantasia on 06/29. Patient states she has moved back to [place] and requested to cancel post-op appointment. Inquired if patient is OK for phone visit and to have x-rays done locally. Patient stated she sees an orthopedic doctor in [place], provided patient with name of x-rays ordered.Patient will speak with clinic in [place] and call back with information and if an x-ray order is needed.    Patient requested op-notes and x-ray reports from [place]. Advised I can mail it to her, confirmed address. Patient will call back with fax number for [facility] office, and will request op-notes, and x-ray report.     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

## 2022-07-21 DEATH — deceased

## 6505-06-20 DEATH — deceased
